# Patient Record
Sex: Female | Born: 1965 | Race: Black or African American | Hispanic: No | Marital: Married
Health system: Southern US, Community
[De-identification: ages and names within clinical notes are randomized; demographics above are authoritative.]

## PROBLEM LIST (undated history)

## (undated) DIAGNOSIS — G629 Polyneuropathy, unspecified: Secondary | ICD-10-CM

## (undated) DIAGNOSIS — E119 Type 2 diabetes mellitus without complications: Secondary | ICD-10-CM

## (undated) DIAGNOSIS — I10 Essential (primary) hypertension: Secondary | ICD-10-CM

## (undated) HISTORY — PX: ABDOMINAL HYSTERECTOMY: SHX81

---

## 2008-01-18 ENCOUNTER — Emergency Department: Payer: Self-pay | Admitting: Emergency Medicine

## 2008-04-21 ENCOUNTER — Ambulatory Visit: Payer: Self-pay | Admitting: Internal Medicine

## 2009-11-26 ENCOUNTER — Emergency Department: Payer: Self-pay | Admitting: Emergency Medicine

## 2010-08-09 ENCOUNTER — Other Ambulatory Visit: Payer: Self-pay | Admitting: Internal Medicine

## 2011-05-06 ENCOUNTER — Emergency Department: Payer: Self-pay | Admitting: Emergency Medicine

## 2011-06-16 ENCOUNTER — Ambulatory Visit: Payer: Self-pay | Admitting: Internal Medicine

## 2011-12-09 ENCOUNTER — Emergency Department: Payer: Self-pay | Admitting: Unknown Physician Specialty

## 2011-12-10 LAB — CBC
HCT: 37.6 % (ref 35.0–47.0)
HGB: 12.7 g/dL (ref 12.0–16.0)
MCH: 30.1 pg (ref 26.0–34.0)
MCV: 89 fL (ref 80–100)
Platelet: 237 10*3/uL (ref 150–440)
RBC: 4.23 10*6/uL (ref 3.80–5.20)
RDW: 13.1 % (ref 11.5–14.5)

## 2011-12-10 LAB — BASIC METABOLIC PANEL
Anion Gap: 11 (ref 7–16)
BUN: 17 mg/dL (ref 7–18)
EGFR (African American): >60
Glucose: 209 mg/dL — ABNORMAL HIGH (ref 65–99)
Potassium: 3.1 mmol/L — ABNORMAL LOW (ref 3.5–5.1)

## 2011-12-10 LAB — HEPATIC FUNCTION PANEL A (ARMC)
Bilirubin, Direct: 0.1 mg/dL (ref 0.00–0.20)
Bilirubin,Total: 0.6 mg/dL (ref 0.2–1.0)
SGPT (ALT): 38 U/L
Total Protein: 7.8 g/dL (ref 6.4–8.2)

## 2011-12-10 LAB — TROPONIN I: Troponin-I: <0.02 ng/mL

## 2012-06-09 ENCOUNTER — Emergency Department: Payer: Self-pay | Admitting: Emergency Medicine

## 2012-06-19 ENCOUNTER — Ambulatory Visit: Payer: Self-pay | Admitting: Internal Medicine

## 2012-06-21 ENCOUNTER — Ambulatory Visit: Payer: Self-pay | Admitting: Internal Medicine

## 2012-06-23 DIAGNOSIS — E1142 Type 2 diabetes mellitus with diabetic polyneuropathy: Secondary | ICD-10-CM | POA: Insufficient documentation

## 2012-06-23 DIAGNOSIS — E785 Hyperlipidemia, unspecified: Secondary | ICD-10-CM | POA: Insufficient documentation

## 2012-06-23 DIAGNOSIS — I10 Essential (primary) hypertension: Secondary | ICD-10-CM | POA: Insufficient documentation

## 2012-07-05 ENCOUNTER — Ambulatory Visit: Payer: Self-pay | Admitting: Internal Medicine

## 2012-10-22 DIAGNOSIS — M5020 Other cervical disc displacement, unspecified cervical region: Secondary | ICD-10-CM | POA: Insufficient documentation

## 2012-10-22 DIAGNOSIS — M5412 Radiculopathy, cervical region: Secondary | ICD-10-CM | POA: Insufficient documentation

## 2012-11-06 DIAGNOSIS — I5189 Other ill-defined heart diseases: Secondary | ICD-10-CM | POA: Insufficient documentation

## 2012-12-11 DIAGNOSIS — E1140 Type 2 diabetes mellitus with diabetic neuropathy, unspecified: Secondary | ICD-10-CM | POA: Insufficient documentation

## 2012-12-11 DIAGNOSIS — M542 Cervicalgia: Secondary | ICD-10-CM | POA: Insufficient documentation

## 2013-05-03 IMAGING — MG MM ADDITIONAL VIEWS AT NO CHARGE
1 series · 3 of 3 positions shown · non-contrast
Comparison: none

REASON FOR EXAM: AV LT SMALL ASYMMETRY
COMMENTS:

PROCEDURE:     MAM - MAM DGTL ADD VW LT  SCR  - July 05, 2012  [DATE]
RESULT:
Comparisons: 06/21/2012 and 06/16/2011.

[L ML · left · 3 of 3 slices shown]
[im 1/3]
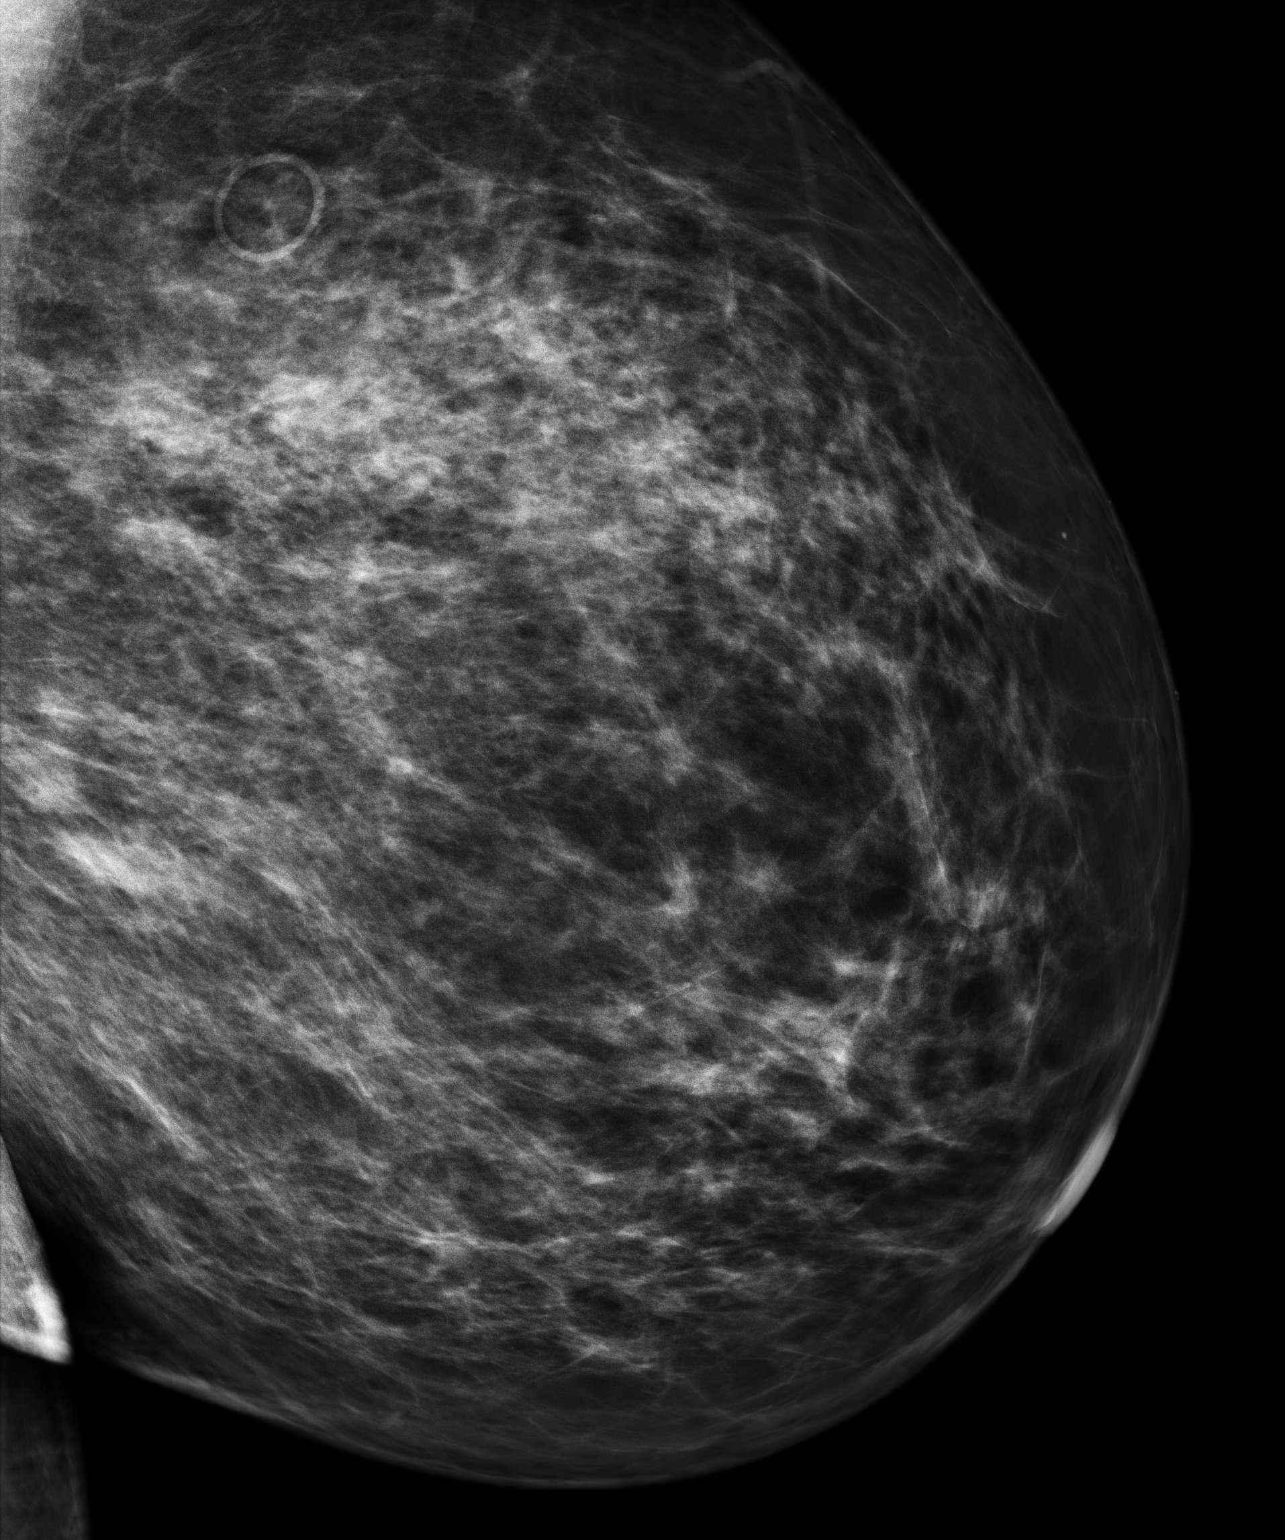
[im 2/3]
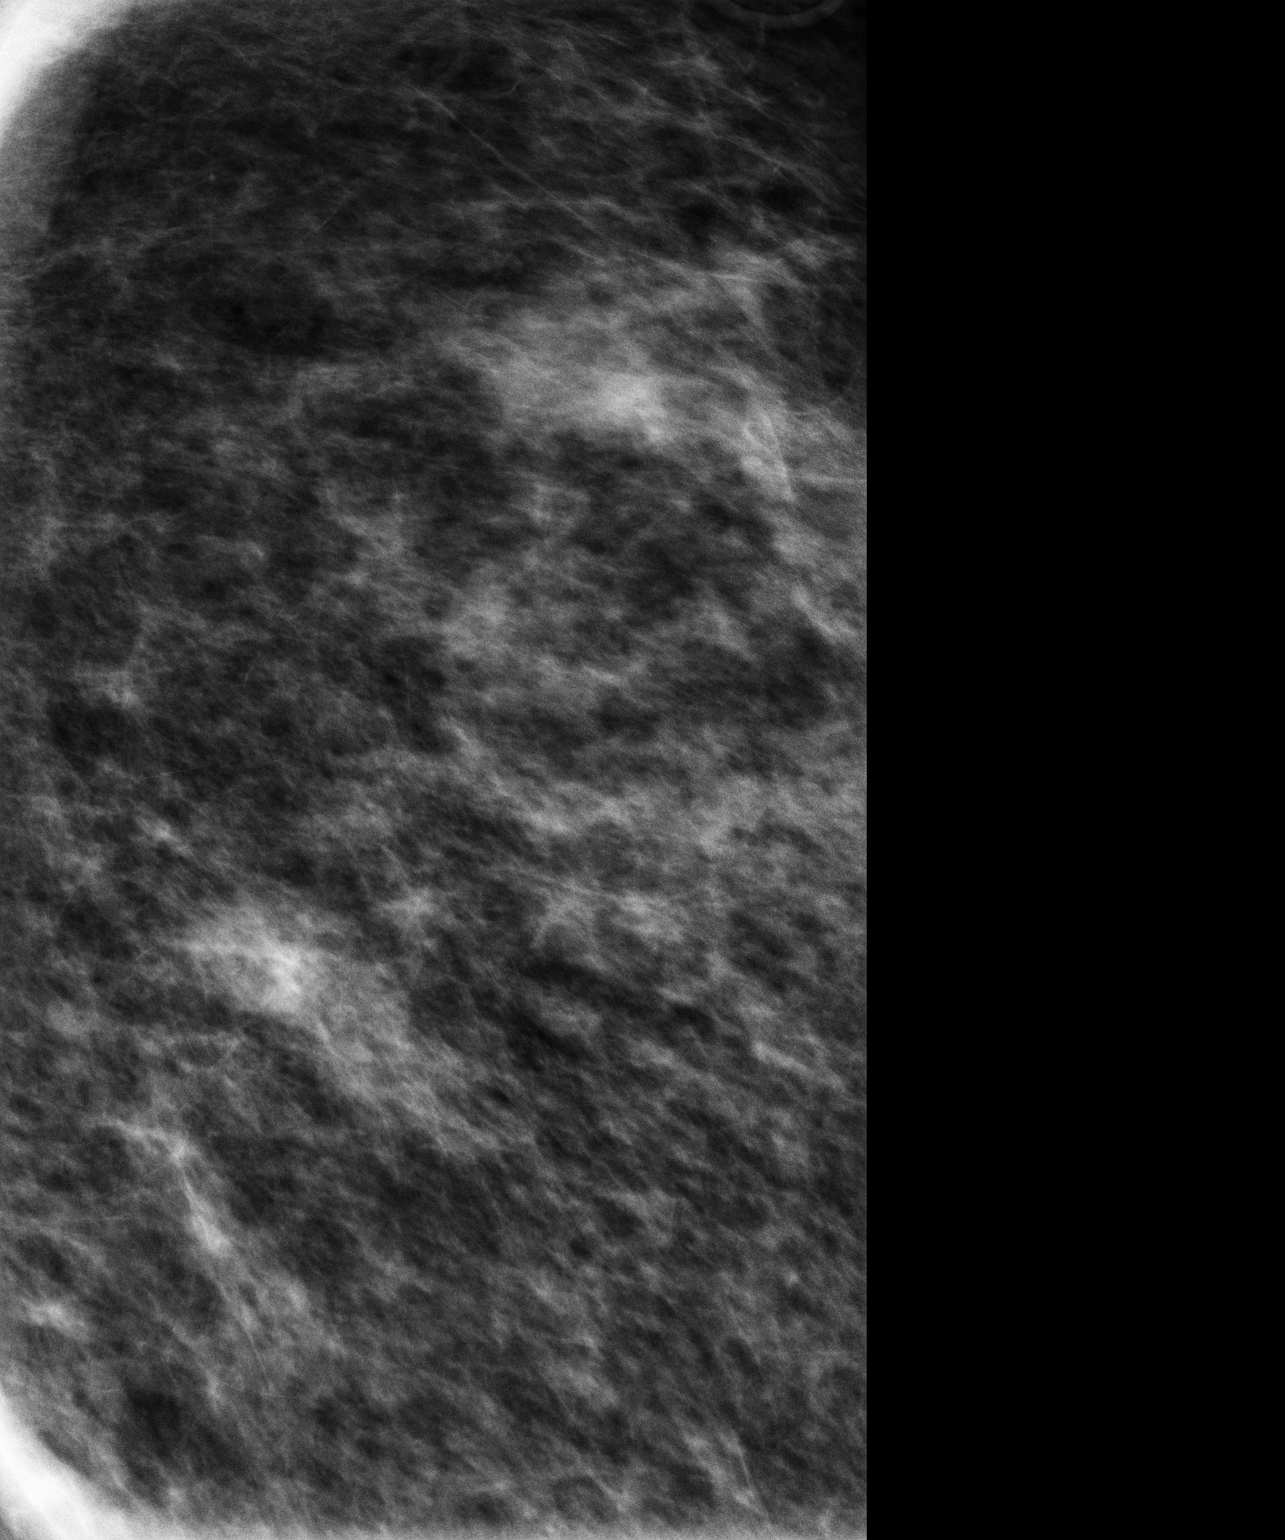
[im 3/3]
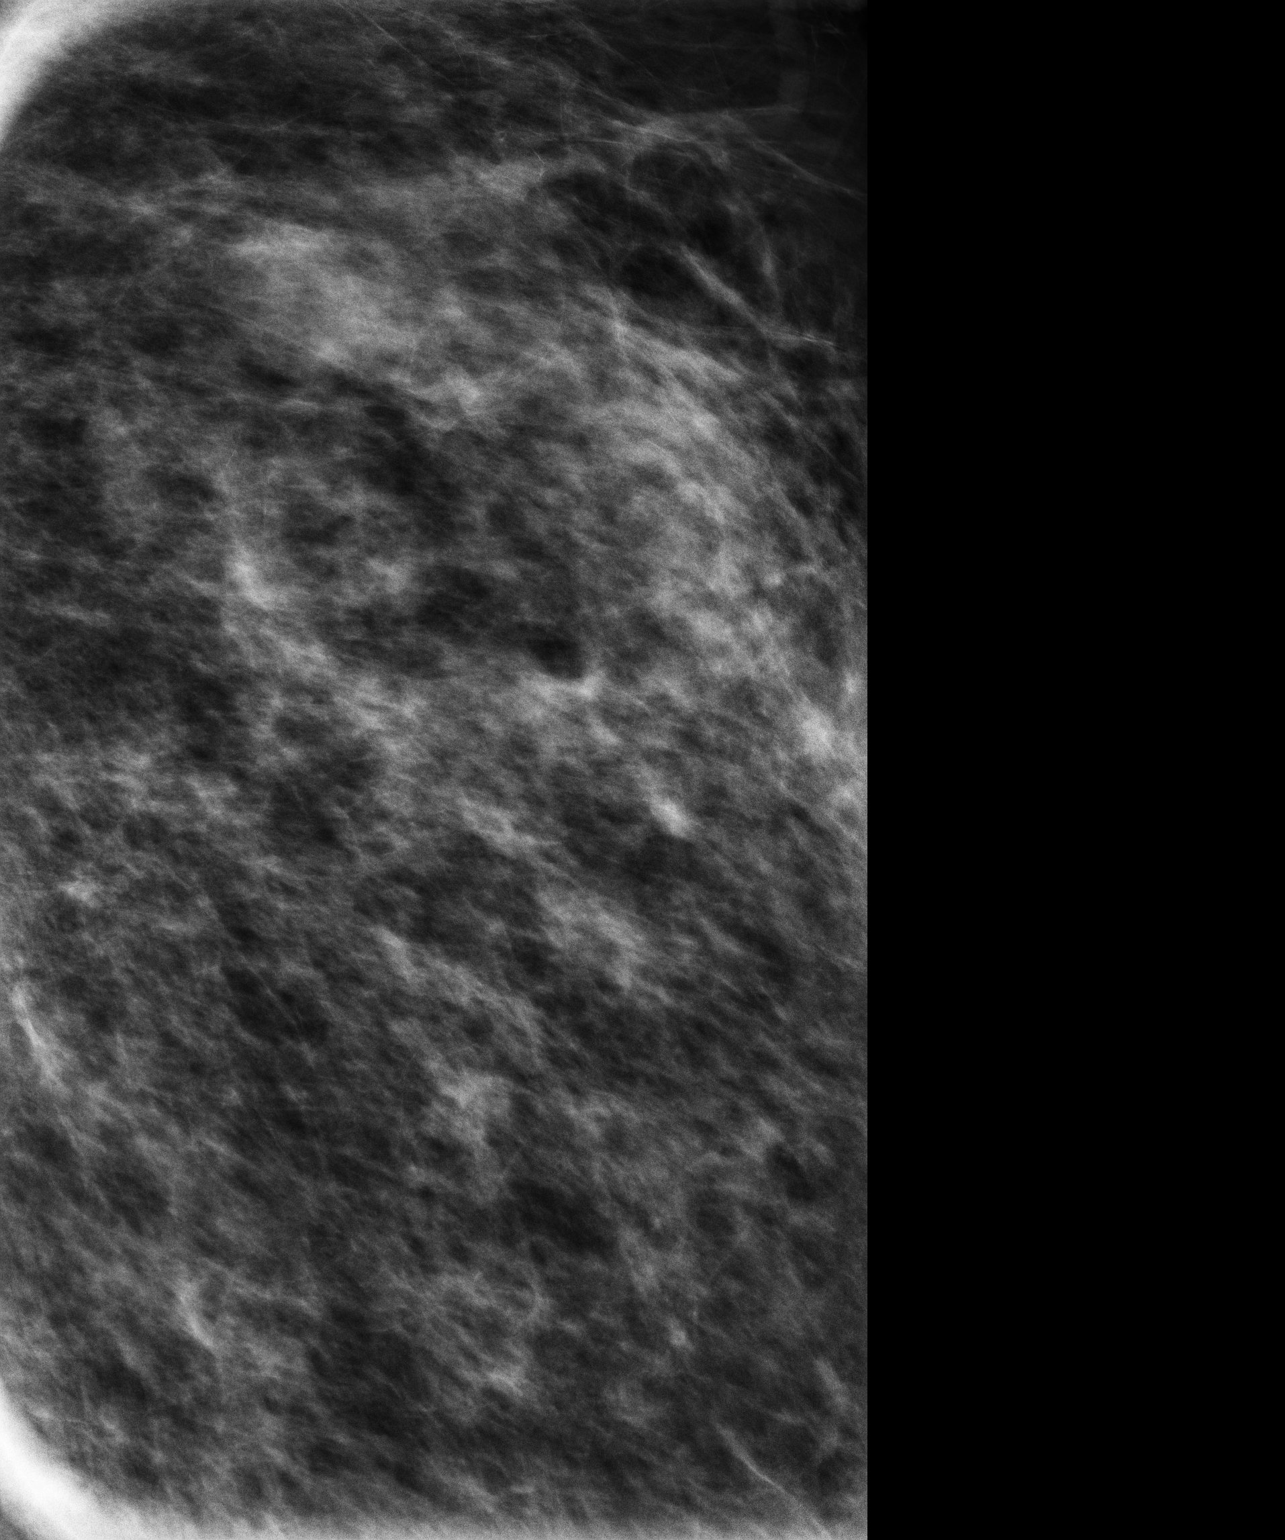

[3 of 3 positions shown; findings below may reference images not displayed]

FINDINGS: True lateral and spot compression magnification views were performed to
evaluate the asymmetry in the left MLO view. With these views, the asymmetry
effaced and assumed the appearance of normal fibroglandular tissue that was
similar to prior.
IMPRESSION: BI-RADS: Category 1 - Negative

Recommend continued annual screening mammography.

A NEGATIVE MAMMOGRAM REPORT DOES NOT PRECLUDE BIOPSY OR OTHER EVALUATION OF
A CLINICALLY PALPABLE OR OTHERWISE SUSPICIOUS MASS OR LESION. BREAST CANCER
MAY NOT BE DETECTED BY MAMMOGRAPHY IN UP TO 10% OF CASES.

## 2013-10-10 ENCOUNTER — Ambulatory Visit: Payer: BC Managed Care – PPO | Attending: Obstetrics and Gynecology | Admitting: Physical Therapy

## 2013-10-10 DIAGNOSIS — IMO0001 Reserved for inherently not codable concepts without codable children: Secondary | ICD-10-CM | POA: Insufficient documentation

## 2013-10-10 DIAGNOSIS — M62838 Other muscle spasm: Secondary | ICD-10-CM | POA: Insufficient documentation

## 2013-10-10 DIAGNOSIS — M2420 Disorder of ligament, unspecified site: Secondary | ICD-10-CM | POA: Insufficient documentation

## 2013-10-10 DIAGNOSIS — M629 Disorder of muscle, unspecified: Secondary | ICD-10-CM | POA: Insufficient documentation

## 2013-10-24 ENCOUNTER — Ambulatory Visit: Payer: BC Managed Care – PPO | Admitting: Physical Therapy

## 2013-10-29 ENCOUNTER — Ambulatory Visit: Payer: BC Managed Care – PPO | Admitting: Physical Therapy

## 2013-11-07 ENCOUNTER — Encounter: Payer: BC Managed Care – PPO | Admitting: Physical Therapy

## 2013-11-21 ENCOUNTER — Ambulatory Visit: Payer: BC Managed Care – PPO | Attending: Obstetrics and Gynecology | Admitting: Physical Therapy

## 2013-11-21 DIAGNOSIS — M2420 Disorder of ligament, unspecified site: Secondary | ICD-10-CM | POA: Insufficient documentation

## 2013-11-21 DIAGNOSIS — M629 Disorder of muscle, unspecified: Secondary | ICD-10-CM | POA: Insufficient documentation

## 2013-11-21 DIAGNOSIS — IMO0001 Reserved for inherently not codable concepts without codable children: Secondary | ICD-10-CM | POA: Insufficient documentation

## 2013-11-21 DIAGNOSIS — M62838 Other muscle spasm: Secondary | ICD-10-CM | POA: Insufficient documentation

## 2013-12-05 ENCOUNTER — Ambulatory Visit: Payer: BC Managed Care – PPO | Admitting: Physical Therapy

## 2015-07-16 DIAGNOSIS — K760 Fatty (change of) liver, not elsewhere classified: Secondary | ICD-10-CM | POA: Insufficient documentation

## 2015-07-16 DIAGNOSIS — L659 Nonscarring hair loss, unspecified: Secondary | ICD-10-CM | POA: Insufficient documentation

## 2016-03-04 ENCOUNTER — Emergency Department: Payer: No Typology Code available for payment source

## 2016-03-04 ENCOUNTER — Emergency Department
Admission: EM | Admit: 2016-03-04 | Discharge: 2016-03-04 | Disposition: A | Discharge status: Home / Self Care | Payer: No Typology Code available for payment source | Attending: Emergency Medicine | Admitting: Emergency Medicine

## 2016-03-04 DIAGNOSIS — Y92410 Unspecified street and highway as the place of occurrence of the external cause: Secondary | ICD-10-CM | POA: Diagnosis not present

## 2016-03-04 DIAGNOSIS — S0990XA Unspecified injury of head, initial encounter: Secondary | ICD-10-CM | POA: Diagnosis present

## 2016-03-04 DIAGNOSIS — I10 Essential (primary) hypertension: Secondary | ICD-10-CM | POA: Insufficient documentation

## 2016-03-04 DIAGNOSIS — S134XXA Sprain of ligaments of cervical spine, initial encounter: Secondary | ICD-10-CM | POA: Diagnosis not present

## 2016-03-04 DIAGNOSIS — E119 Type 2 diabetes mellitus without complications: Secondary | ICD-10-CM | POA: Diagnosis not present

## 2016-03-04 DIAGNOSIS — Y9389 Activity, other specified: Secondary | ICD-10-CM | POA: Insufficient documentation

## 2016-03-04 DIAGNOSIS — S161XXA Strain of muscle, fascia and tendon at neck level, initial encounter: Secondary | ICD-10-CM | POA: Diagnosis not present

## 2016-03-04 DIAGNOSIS — Y999 Unspecified external cause status: Secondary | ICD-10-CM | POA: Insufficient documentation

## 2016-03-04 HISTORY — DX: Type 2 diabetes mellitus without complications: E11.9

## 2016-03-04 HISTORY — DX: Essential (primary) hypertension: I10

## 2016-03-04 HISTORY — DX: Polyneuropathy, unspecified: G62.9

## 2016-03-04 MED ORDER — TRAMADOL HCL 50 MG PO TABS
50.0000 mg | ORAL_TABLET | Freq: Once | ORAL | Status: AC
  Administered 2016-03-04: 50 mg via ORAL
  Filled 2016-03-04: qty 1

## 2016-03-04 MED ORDER — ONDANSETRON 4 MG PO TBDP
4.0000 mg | ORAL_TABLET | Freq: Once | ORAL | Status: AC
  Administered 2016-03-04: 4 mg via ORAL
  Filled 2016-03-04: qty 1

## 2016-03-04 MED ORDER — OXYCODONE-ACETAMINOPHEN 5-325 MG PO TABS: 1.0000 | ORAL_TABLET | Freq: Once | ORAL | Status: DC

## 2016-03-04 MED ORDER — CARISOPRODOL 350 MG PO TABS: 350.0000 mg | ORAL_TABLET | Freq: Three times a day (TID) | ORAL | 0 refills | Status: AC | PRN

## 2016-03-04 MED ORDER — CYCLOBENZAPRINE HCL 10 MG PO TABS
5.0000 mg | ORAL_TABLET | Freq: Once | ORAL | Status: AC
  Administered 2016-03-04: 5 mg via ORAL
  Filled 2016-03-04: qty 1

## 2016-03-04 MED ORDER — OXYCODONE-ACETAMINOPHEN 5-325 MG PO TABS
1.0000 | ORAL_TABLET | Freq: Once | ORAL | Status: AC
  Administered 2016-03-04: 1 via ORAL
  Filled 2016-03-04: qty 1
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
## 2016-03-04 NOTE — ED Notes (Signed)
Pt returned from [facility]. Family is at the bedside.

## 2016-03-04 NOTE — ED Notes (Signed)
Pt alert and oriented X4, active, cooperative, pt in NAD. RR even and unlabored, color WNL.  Pt informed to return if any life threatening symptoms occur.   

## 2016-03-04 NOTE — ED Notes (Signed)
[employer] officer at the bedside speaking with pt.

## 2016-05-24 DIAGNOSIS — M7989 Other specified soft tissue disorders: Secondary | ICD-10-CM | POA: Insufficient documentation

## 2017-01-10 DIAGNOSIS — R600 Localized edema: Secondary | ICD-10-CM | POA: Insufficient documentation

## 2017-11-26 DIAGNOSIS — Z6841 Body Mass Index (BMI) 40.0 and over, adult: Secondary | ICD-10-CM | POA: Insufficient documentation

## 2018-01-09 DIAGNOSIS — R5383 Other fatigue: Secondary | ICD-10-CM | POA: Insufficient documentation

## 2018-01-09 DIAGNOSIS — F329 Major depressive disorder, single episode, unspecified: Secondary | ICD-10-CM | POA: Insufficient documentation

## 2018-06-01 ENCOUNTER — Other Ambulatory Visit: Payer: BLUE CROSS/BLUE SHIELD

## 2018-06-12 ENCOUNTER — Ambulatory Visit: Admit: 2018-06-12 | Payer: BLUE CROSS/BLUE SHIELD | Admitting: Orthopedic Surgery

## 2018-06-12 SURGERY — TENNIS ELBOW RELEASE/NIRSCHEL PROCEDURE
Anesthesia: Choice | Laterality: Left

## 2019-02-05 ENCOUNTER — Encounter: Payer: Self-pay | Admitting: Podiatry

## 2019-02-05 ENCOUNTER — Other Ambulatory Visit: Payer: Self-pay | Admitting: Podiatry

## 2019-02-05 ENCOUNTER — Ambulatory Visit (INDEPENDENT_AMBULATORY_CARE_PROVIDER_SITE_OTHER): Payer: BC Managed Care – PPO | Admitting: Podiatry

## 2019-02-05 ENCOUNTER — Other Ambulatory Visit: Payer: Self-pay

## 2019-02-05 ENCOUNTER — Ambulatory Visit (INDEPENDENT_AMBULATORY_CARE_PROVIDER_SITE_OTHER): Payer: BC Managed Care – PPO

## 2019-02-05 VITALS — Temp 97.3°F

## 2019-02-05 DIAGNOSIS — M79671 Pain in right foot: Secondary | ICD-10-CM

## 2019-02-05 DIAGNOSIS — M79672 Pain in left foot: Secondary | ICD-10-CM

## 2019-02-05 DIAGNOSIS — E0843 Diabetes mellitus due to underlying condition with diabetic autonomic (poly)neuropathy: Secondary | ICD-10-CM | POA: Diagnosis not present

## 2019-02-05 DIAGNOSIS — M722 Plantar fascial fibromatosis: Secondary | ICD-10-CM

## 2019-02-05 DIAGNOSIS — M779 Enthesopathy, unspecified: Secondary | ICD-10-CM | POA: Diagnosis not present

## 2019-02-05 DIAGNOSIS — G629 Polyneuropathy, unspecified: Secondary | ICD-10-CM | POA: Diagnosis not present

## 2019-02-05 MED ORDER — MELOXICAM 15 MG PO TABS: 15.0000 mg | ORAL_TABLET | Freq: Every day | ORAL | 0 refills | Status: DC

## 2019-02-05 NOTE — Patient Instructions (Signed)
If was nice to meet you today. If you have any questions or any further concerns, please feel fee to give me a call. You can call our office at [phone] or please feel fee to send me a message through [place].   For instructions on how to put on your Night Splint, please visit [email or web address]   Plantar Fasciitis (Heel Spur Syndrome) with Rehab The plantar fascia is a fibrous, ligament-like, soft-tissue structure that spans the bottom of the foot. Plantar fasciitis is a condition that causes pain in the foot due to inflammation of the tissue. SYMPTOMS   Pain and tenderness on the underneath side of the foot.  Pain that worsens with standing or walking. CAUSES  Plantar fasciitis is caused by irritation and injury to the plantar fascia on the underneath side of the foot. Common mechanisms of injury include:  Direct trauma to bottom of the foot.  Damage to a small nerve that runs under the foot where the main fascia attaches to the heel bone.  Stress placed on the plantar fascia due to bone spurs. RISK INCREASES WITH:   Activities that place stress on the plantar fascia (running, jumping, pivoting, or cutting).  Poor strength and flexibility.  Improperly fitted shoes.  Tight calf muscles.  Flat feet.  Failure to warm-up properly before activity.  Obesity. PREVENTION  Warm up and stretch properly before activity.  Allow for adequate recovery between workouts.  Maintain physical fitness:  Strength, flexibility, and endurance.  Cardiovascular fitness.  Maintain a health body weight.  Avoid stress on the plantar fascia.  Wear properly fitted shoes, including arch supports for individuals who have flat feet.  PROGNOSIS  If treated properly, then the symptoms of plantar fasciitis usually resolve without surgery. However, occasionally surgery is necessary.  RELATED COMPLICATIONS   Recurrent symptoms that may result in a chronic condition.  Problems of  the lower back that are caused by compensating for the injury, such as limping.  Pain or weakness of the foot during push-off following surgery.  Chronic inflammation, scarring, and partial or complete fascia tear, occurring more often from repeated injections.  TREATMENT  Treatment initially involves the use of ice and medication to help reduce pain and inflammation. The use of strengthening and stretching exercises may help reduce pain with activity, especially stretches of the Achilles tendon. These exercises may be performed at home or with a therapist. Your caregiver may recommend that you use heel cups of arch supports to help reduce stress on the plantar fascia. Occasionally, corticosteroid injections are given to reduce inflammation. If symptoms persist for greater than 6 months despite non-surgical (conservative), then surgery may be recommended.   MEDICATION   If pain medication is necessary, then nonsteroidal anti-inflammatory medications, such as aspirin and ibuprofen, or other minor pain relievers, such as acetaminophen, are often recommended.  Do not take pain medication within 7 days before surgery.  Prescription pain relievers may be given if deemed necessary by your caregiver. Use only as directed and only as much as you need.  Corticosteroid injections may be given by your caregiver. These injections should be reserved for the most serious cases, because they may only be given a certain number of times.  HEAT AND COLD  Cold treatment (icing) relieves pain and reduces inflammation. Cold treatment should be applied for 10 to 15 minutes every 2 to 3 hours for inflammation and pain and immediately after any activity that aggravates your symptoms. Use ice packs or massage the area with a  piece of ice (ice massage).  Heat treatment may be used prior to performing the stretching and strengthening activities prescribed by your caregiver, physical therapist, or athletic trainer. Use a  heat pack or soak the injury in warm water.  SEEK IMMEDIATE MEDICAL CARE IF:  Treatment seems to offer no benefit, or the condition worsens.  Any medications produce adverse side effects.  EXERCISES- RANGE OF MOTION (ROM) AND STRETCHING EXERCISES - Plantar Fasciitis (Heel Spur Syndrome) These exercises may help you when beginning to rehabilitate your injury. Your symptoms may resolve with or without further involvement from your physician, physical therapist or athletic trainer. While completing these exercises, remember:   Restoring tissue flexibility helps normal motion to return to the joints. This allows healthier, less painful movement and activity.  An effective stretch should be held for at least 30 seconds.  A stretch should never be painful. You should only feel a gentle lengthening or release in the stretched tissue.  RANGE OF MOTION - Toe Extension, Flexion  Sit with your right / left leg crossed over your opposite knee.  Grasp your toes and gently pull them back toward the top of your foot. You should feel a stretch on the bottom of your toes and/or foot.  Hold this stretch for 10 seconds.  Now, gently pull your toes toward the bottom of your foot. You should feel a stretch on the top of your toes and or foot.  Hold this stretch for 10 seconds. Repeat  times. Complete this stretch 3 times per day.   RANGE OF MOTION - Ankle Dorsiflexion, Active Assisted  Remove shoes and sit on a chair that is preferably not on a carpeted surface.  Place right / left foot under knee. Extend your opposite leg for support.  Keeping your heel down, slide your right / left foot back toward the chair until you feel a stretch at your ankle or calf. If you do not feel a stretch, slide your bottom forward to the edge of the chair, while still keeping your heel down.  Hold this stretch for 10 seconds. Repeat 3 times. Complete this stretch 2 times per day.   STRETCH  Gastroc, Standing   Place hands on wall.  Extend right / left leg, keeping the front knee somewhat bent.  Slightly point your toes inward on your back foot.  Keeping your right / left heel on the floor and your knee straight, shift your weight toward the wall, not allowing your back to arch.  You should feel a gentle stretch in the right / left calf. Hold this position for 10 seconds. Repeat 3 times. Complete this stretch 2 times per day.  STRETCH  Soleus, Standing  Place hands on wall.  Extend right / left leg, keeping the other knee somewhat bent.  Slightly point your toes inward on your back foot.  Keep your right / left heel on the floor, bend your back knee, and slightly shift your weight over the back leg so that you feel a gentle stretch deep in your back calf.  Hold this position for 10 seconds. Repeat 3 times. Complete this stretch 2 times per day.  STRETCH  Gastrocsoleus, Standing  Note: This exercise can place a lot of stress on your foot and ankle. Please complete this exercise only if specifically instructed by your caregiver.   Place the ball of your right / left foot on a step, keeping your other foot firmly on the same step.  Hold on to  the wall or a rail for balance.  Slowly lift your other foot, allowing your body weight to press your heel down over the edge of the step.  You should feel a stretch in your right / left calf.  Hold this position for 10 seconds.  Repeat this exercise with a slight bend in your right / left knee. Repeat 3 times. Complete this stretch 2 times per day.   STRENGTHENING EXERCISES - Plantar Fasciitis (Heel Spur Syndrome)  These exercises may help you when beginning to rehabilitate your injury. They may resolve your symptoms with or without further involvement from your physician, physical therapist or athletic trainer. While completing these exercises, remember:   Muscles can gain both the endurance and the strength needed for everyday activities  through controlled exercises.  Complete these exercises as instructed by your physician, physical therapist or athletic trainer. Progress the resistance and repetitions only as guided.  STRENGTH - Towel Curls  Sit in a chair positioned on a non-carpeted surface.  Place your foot on a towel, keeping your heel on the floor.  Pull the towel toward your heel by only curling your toes. Keep your heel on the floor. Repeat 3 times. Complete this exercise 2 times per day.  STRENGTH - Ankle Inversion  Secure one end of a rubber exercise band/tubing to a fixed object (table, pole). Loop the other end around your foot just before your toes.  Place your fists between your knees. This will focus your strengthening at your ankle.  Slowly, pull your big toe up and in, making sure the band/tubing is positioned to resist the entire motion.  Hold this position for 10 seconds.  Have your muscles resist the band/tubing as it slowly pulls your foot back to the starting position. Repeat 3 times. Complete this exercises 2 times per day.  Document Released: 06/27/2005 Document Revised: 09/19/2011 Document Reviewed: 10/09/2008 [facility] Patient Information 2014 [place], [place].  Meloxicam tablets What is this medicine? MELOXICAM (mel OX i cam) is a non-steroidal anti-inflammatory drug (NSAID). It is used to reduce swelling and to treat pain. It may be used for osteoarthritis, rheumatoid arthritis, or juvenile rheumatoid arthritis. This medicine may be used for other purposes; ask your health care provider or pharmacist if you have questions. COMMON BRAND NAME(S): Mobic What should I tell my health care provider before I take this medicine? They need to know if you have any of these conditions:  bleeding disorders  cigarette smoker  coronary artery bypass graft (CABG) surgery within the past 2 weeks  drink more than 3 alcohol-containing drinks per day  heart disease  high blood pressure  history  of stomach bleeding  kidney disease  liver disease  lung or breathing disease, like asthma  stomach or intestine problems  an unusual or allergic reaction to meloxicam, aspirin, other NSAIDs, other medicines, foods, dyes, or preservatives  pregnant or trying to get pregnant  breast-feeding How should I use this medicine? Take this medicine by mouth with a full glass of water. Follow the directions on the prescription label. You can take it with or without food. If it upsets your stomach, take it with food. Take your medicine at regular intervals. Do not take it more often than directed. Do not stop taking except on your doctor's advice. A special MedGuide will be given to you by the pharmacist with each prescription and refill. Be sure to read this information carefully each time. Talk to your pediatrician regarding the use of this medicine  in children. While this drug may be prescribed for selected conditions, precautions do apply. Patients over 51 years old may have a stronger reaction and need a smaller dose. Overdosage: If you think you have taken too much of this medicine contact a poison control center or emergency room at once. NOTE: This medicine is only for you. Do not share this medicine with others. What if I miss a dose? If you miss a dose, take it as soon as you can. If it is almost time for your next dose, take only that dose. Do not take double or extra doses. What may interact with this medicine? Do not take this medicine with any of the following medications:  cidofovir  ketorolac This medicine may also interact with the following medications:  aspirin and aspirin-like medicines  certain medicines for blood pressure, heart disease, irregular heart beat  certain medicines for depression, anxiety, or psychotic disturbances  certain medicines that treat or prevent blood clots like warfarin, enoxaparin, dalteparin, apixaban, dabigatran, rivaroxaban  cyclosporine   diuretics  fluconazole  lithium  methotrexate  other NSAIDs, medicines for pain and inflammation, like ibuprofen and naproxen  pemetrexed This list may not describe all possible interactions. Give your health care provider a list of all the medicines, herbs, non-prescription drugs, or dietary supplements you use. Also tell them if you smoke, drink alcohol, or use illegal drugs. Some items may interact with your medicine. What should I watch for while using this medicine? Tell your doctor or healthcare provider if your symptoms do not start to get better or if they get worse. This medicine may cause serious skin reactions. They can happen weeks to months after starting the medicine. Contact your healthcare provider right away if you notice fevers or flu-like symptoms with a rash. The rash may be red or purple and then turn into blisters or peeling of the skin. Or, you might notice a red rash with swelling of the face, lips or lymph nodes in your neck or under your arms. Do not take other medicines that contain aspirin, ibuprofen, or naproxen with this medicine. Side effects such as stomach upset, nausea, or ulcers may be more likely to occur. Many medicines available without a prescription should not be taken with this medicine. This medicine can cause ulcers and bleeding in the stomach and intestines at any time during treatment. This can happen with no warning and may cause death. There is increased risk with taking this medicine for a long time. Smoking, drinking alcohol, older age, and poor health can also increase risks. Call your doctor right away if you have stomach pain or blood in your vomit or stool. This medicine does not prevent heart attack or stroke. In fact, this medicine may increase the chance of a heart attack or stroke. The chance may increase with longer use of this medicine and in people who have heart disease. If you take aspirin to prevent heart attack or stroke, talk with  your doctor or healthcare provider. What side effects may I notice from receiving this medicine? Side effects that you should report to your doctor or health care professional as soon as possible:  allergic reactions like skin rash, itching or hives, swelling of the face, lips, or tongue  nausea, vomiting  redness, blistering, peeling, or loosening of the skin, including inside the mouth  signs and symptoms of a blood clot such as breathing problems; changes in vision; chest pain; severe, sudden headache; pain, swelling, warmth in the leg;  trouble speaking; sudden numbness or weakness of the face, arm, or leg  signs and symptoms of bleeding such as bloody or black, tarry stools; red or dark-brown urine; spitting up blood or brown material that looks like coffee grounds; red spots on the skin; unusual bruising or bleeding from the eye, gums, or nose  signs and symptoms of liver injury like dark yellow or brown urine; general ill feeling or flu-like symptoms; light-colored stools; loss of appetite; nausea; right upper belly pain; unusually weak or tired; yellowing of the eyes or skin  signs and symptoms of stroke like changes in vision; confusion; trouble speaking or understanding; severe headaches; sudden numbness or weakness of the face, arm, or leg; trouble walking; dizziness; loss of balance or coordination Side effects that usually do not require medical attention (report to your doctor or health care professional if they continue or are bothersome):  constipation  diarrhea  gas This list may not describe all possible side effects. Call your doctor for medical advice about side effects. You may report side effects to FDA at 1-800-FDA-1088. Where should I keep my medicine? Keep out of the reach of children. Store at room temperature between 15 and 30 degrees C (59 and 86 degrees F). Throw away any unused medicine after the expiration date. NOTE: This sheet is a summary. It may not cover  all possible information. If you have questions about this medicine, talk to your doctor, pharmacist, or health care provider.  2020 Elsevier/Gold Standard (2018-09-26 11:21:28)

## 2019-02-08 NOTE — Progress Notes (Signed)
Subjective:   Patient ID: Erika Elliott, female   DOB: 53 y.o.   MRN: [id number]   HPI 53 year old female presents the office today for concerns of pain to both of her feet mostly in the heels.  She says is been ongoing last 3 weeks.  She has a history of plantar fasciitis.  She had injections previously which were helpful.  She has not had some in several years.  She has pain most in the morning when she first gets up after being on her feet all day she sits down and stands back up is when she gets pain.  She is on her feet a lot as a CNA.  No recent injury or falls.  She does have neuropathy as well.   Review of Systems  All other systems reviewed and are negative.  Past Medical History:  Diagnosis Date  . Diabetes mellitus without complication ([place])   . Hypertension   . Neuropathy     Past Surgical History:  Procedure Laterality Date  . ABDOMINAL HYSTERECTOMY       Current Outpatient Medications:  .  atorvastatin (LIPITOR) 10 MG tablet, Take 10 mg by mouth daily., Disp: , Rfl:  .  gabapentin (NEURONTIN) 100 MG capsule, Take 100 mg by mouth 3 (three) times daily., Disp: , Rfl:  .  metFORMIN (GLUCOPHAGE) 500 MG tablet, Take by mouth 2 (two) times daily with a meal., Disp: , Rfl:  .  meloxicam (MOBIC) 15 MG tablet, Take 1 tablet (15 mg total) by mouth daily., Disp: 14 tablet, Rfl: 0  Allergies  Allergen Reactions  . Oxycodone-Acetaminophen Nausea Only        Objective:  Physical Exam  General: AAO x3, NAD  Dermatological: Skin is warm, dry and supple bilateral. Nails x 10 are well manicured; remaining integument appears unremarkable at this time. There are no open sores, no preulcerative lesions, no rash or signs of infection present.  Vascular: Dorsalis Pedis artery and Posterior Tibial artery pedal pulses are 2/4 bilateral with immedate capillary fill time. There is no pain with calf compression, swelling, warmth, erythema.   Neruologic: Previously diagnosed with  neuropathy. Negative tinel sign.   Musculoskeletal: Tenderness palpation on the plantar medial tubercle of the calcaneus at the insertion of plantar fascia bilaterally.  Mild discomfort in the arch of the foot on medial band.  Plantar fascial appears to be intact.  Achilles tendon appears to be intact.  No pain with lateral compression of calcaneus.  No other areas of tenderness.  Muscular strength 5/5 in all groups tested bilateral.  Gait: Unassisted, Nonantalgic.     Assessment:   53 year old female with bilateral foot pain, plantar fasciitis; neuropathy    Plan:  -Treatment options discussed including all alternatives, risks, and complications -Etiology of symptoms were discussed -X-rays were obtained and reviewed with the patient.  No evidence of acute fracture or stress fracture. -Plantar fascial brace dispensed x2 -Night splint x1 -Steroid injection bilaterally.  See procedure note below -Stretching, icing daily -Shoe modifications and orthotics  Procedure: Injection Tendon/Ligament Discussed alternatives, risks, complications and verbal consent was obtained.  Location: Bilateral plantar fascia at the glabrous junction; medial approach. Skin Prep: Alcohol Injectate: 0.5cc 0.5% marcaine plain, 0.5 cc 2% lidocaine plain and, 1 cc kenalog 10. Disposition: Patient tolerated procedure well. Injection site dressed with a band-aid.  Post-injection care was discussed and return precautions discussed.    Trula Slade DPM

## 2019-02-26 ENCOUNTER — Other Ambulatory Visit: Payer: Self-pay

## 2019-02-26 ENCOUNTER — Encounter: Payer: Self-pay | Admitting: Podiatry

## 2019-02-26 ENCOUNTER — Ambulatory Visit (INDEPENDENT_AMBULATORY_CARE_PROVIDER_SITE_OTHER): Payer: BC Managed Care – PPO | Admitting: Podiatry

## 2019-02-26 ENCOUNTER — Telehealth: Payer: Self-pay | Admitting: *Deleted

## 2019-02-26 VITALS — Temp 98.3°F

## 2019-02-26 DIAGNOSIS — M722 Plantar fascial fibromatosis: Secondary | ICD-10-CM | POA: Diagnosis not present

## 2019-02-26 DIAGNOSIS — R252 Cramp and spasm: Secondary | ICD-10-CM

## 2019-02-26 DIAGNOSIS — M7989 Other specified soft tissue disorders: Secondary | ICD-10-CM | POA: Diagnosis not present

## 2019-02-26 DIAGNOSIS — R609 Edema, unspecified: Secondary | ICD-10-CM

## 2019-02-26 MED ORDER — MELOXICAM 15 MG PO TABS: 15.0000 mg | ORAL_TABLET | Freq: Every day | ORAL | 0 refills | Status: AC

## 2019-02-26 NOTE — Telephone Encounter (Signed)
Called phone number listed for Erika Elliott, female voice stated the phone number was incorrect.

## 2019-02-26 NOTE — Patient Instructions (Signed)

## 2019-02-26 NOTE — Telephone Encounter (Signed)
Unable to discuss pt's location preference for vascular testing due to phone line busy.

## 2019-02-26 NOTE — Telephone Encounter (Signed)
Unable to speak with pt phone was busy.

## 2019-02-26 NOTE — Telephone Encounter (Signed)
Faxed orders for venous doppler and arterial doppler to [place].

## 2019-02-28 ENCOUNTER — Other Ambulatory Visit: Payer: Self-pay

## 2019-02-28 ENCOUNTER — Other Ambulatory Visit: Payer: Self-pay | Admitting: Podiatry

## 2019-02-28 ENCOUNTER — Ambulatory Visit (HOSPITAL_COMMUNITY)
Admission: RE | Admit: 2019-02-28 | Discharge: 2019-02-28 | Disposition: A | Discharge status: Home / Self Care | Payer: BC Managed Care – PPO | Source: Ambulatory Visit | Attending: Cardiology | Admitting: Cardiology

## 2019-02-28 DIAGNOSIS — R252 Cramp and spasm: Secondary | ICD-10-CM | POA: Insufficient documentation

## 2019-02-28 DIAGNOSIS — R609 Edema, unspecified: Secondary | ICD-10-CM | POA: Insufficient documentation

## 2019-03-01 ENCOUNTER — Ambulatory Visit (HOSPITAL_COMMUNITY)
Admission: RE | Admit: 2019-03-01 | Discharge: 2019-03-01 | Disposition: A | Discharge status: Home / Self Care | Payer: BC Managed Care – PPO | Source: Ambulatory Visit | Attending: Cardiology | Admitting: Cardiology

## 2019-03-01 ENCOUNTER — Telehealth: Payer: Self-pay | Admitting: *Deleted

## 2019-03-01 DIAGNOSIS — R252 Cramp and spasm: Secondary | ICD-10-CM | POA: Insufficient documentation

## 2019-03-01 NOTE — Telephone Encounter (Signed)
I informed pt of Dr. Wagoner's review of results. 

## 2019-03-01 NOTE — Telephone Encounter (Signed)
-----   Message from Matthew R Wagoner, DPM sent at 03/01/2019  6:56 AM EDT ----- Negative for DVT- val can you please let her know? Thanks. 

## 2019-03-04 NOTE — Progress Notes (Signed)
Subjective: 53 year old female presents the office today for follow-up evaluation of plantar fasciitis, heel pain. She states that the injection, bracing, stretching is helping. She has been getting swelling and cramping to her calf which is new. No injury or changes otherwise.  Denies any systemic complaints such as fevers, chills, nausea, vomiting. No acute changes since last appointment, and no other complaints at this time.   Objective: AAO x3, NAD DP/PT pulses palpable bilaterally, CRT less than 3 seconds There is improvement yet continued tenderness palpation on plantar medial tubercle of the calcaneus insertion of plantar fascia bilaterally.  Plantar fascial peers to be intact. Across the calcaneus.  No pain Achilles tendon.  Negative Tinel sign. No open lesions or pre-ulcerative lesions.  No pain with calf compression, swelling, warmth, erythema  Assessment: Bilateral heel pain, plantar fasciitis; leg swelling/cramping  Plan: -All treatment options discussed with the patient including all alternatives, risks, complications.  -Due to the swelling of the cramping will order arterial studies. -Plan continue stretching, icing daily.  Continue plantar fascial braces and night splint. Discussed modifications and orthotics. -Patient encouraged to call the office with any questions, concerns, change in symptoms.   Trula Slade DPM

## 2019-03-05 ENCOUNTER — Telehealth: Payer: Self-pay | Admitting: *Deleted

## 2019-03-05 NOTE — Telephone Encounter (Signed)
I informed pt of Dr. Leigh Aurora review of Venous doppler results.

## 2019-03-05 NOTE — Telephone Encounter (Signed)
-----   Message from Trula Slade, DPM sent at 03/01/2019  6:56 AM EDT ----- Negative for DVT- val can you please let her know? Thanks.

## 2019-03-06 ENCOUNTER — Telehealth: Payer: Self-pay | Admitting: *Deleted

## 2019-03-06 NOTE — Telephone Encounter (Signed)
Pt called and I informed of Dr. Wagoner's review of results. 

## 2019-03-06 NOTE — Telephone Encounter (Deleted)
-----   Message from Matthew R Wagoner, DPM sent at 03/01/2019  3:58 PM EDT ----- Val- please let her know that the circulation test was normal 

## 2019-03-06 NOTE — Telephone Encounter (Signed)
-----   Message from Trula Slade, DPM sent at 03/01/2019  3:58 PM EDT ----- Val- please let her know that the circulation test was normal

## 2019-03-06 NOTE — Telephone Encounter (Signed)
Left message requesting a call back to discuss results. 

## 2019-03-26 ENCOUNTER — Ambulatory Visit: Payer: BC Managed Care – PPO | Admitting: Podiatry

## 2019-04-16 ENCOUNTER — Other Ambulatory Visit: Payer: Self-pay

## 2019-04-16 ENCOUNTER — Ambulatory Visit (INDEPENDENT_AMBULATORY_CARE_PROVIDER_SITE_OTHER): Payer: BC Managed Care – PPO | Admitting: Podiatry

## 2019-04-16 DIAGNOSIS — M779 Enthesopathy, unspecified: Secondary | ICD-10-CM

## 2019-04-16 DIAGNOSIS — E1149 Type 2 diabetes mellitus with other diabetic neurological complication: Secondary | ICD-10-CM

## 2019-04-16 DIAGNOSIS — M722 Plantar fascial fibromatosis: Secondary | ICD-10-CM

## 2019-04-16 MED ORDER — IBUPROFEN 600 MG PO TABS: 600.0000 mg | ORAL_TABLET | Freq: Three times a day (TID) | ORAL | 0 refills | Status: DC | PRN

## 2019-04-18 ENCOUNTER — Telehealth: Payer: Self-pay | Admitting: *Deleted

## 2019-04-18 DIAGNOSIS — M779 Enthesopathy, unspecified: Secondary | ICD-10-CM

## 2019-04-18 DIAGNOSIS — T148XXA Other injury of unspecified body region, initial encounter: Secondary | ICD-10-CM

## 2019-04-18 NOTE — Telephone Encounter (Signed)
-----   Message from Trula Slade, DPM sent at 04/18/2019  7:12 AM EDT ----- Can you please order an MRI of the left ankle to rule out partial tear of flexor tendons? Thanks.

## 2019-04-18 NOTE — Progress Notes (Signed)
Subjective: 53 year old female presents the office today for follow-up evaluation of bilateral foot pain with left side worse than the right.  She said the right side starting to hurt more because of compensation.  She also states on the left ankle she started get discomfort she points along both sides of her ankle.  Minimal swelling on the left side but no erythema or warmth.  No recent injury or falls.  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  Denies any systemic complaints such as fevers, chills, nausea, vomiting. No acute changes since last appointment, and no other complaints at this time.   Objective: AAO x3, NAD DP/PT pulses palpable bilaterally, CRT less than 3 seconds There is continuation of tenderness on the plantar medial tubercle of the calcaneus at the insertion of plantar fascia.  Plantar fascia appears to be intact.  No pain with Achilles tendon bilaterally.  In the left side there is tenderness on the course of the flexor, posterior tibial tendon as well as the peroneal tendons just posterior to the malleoli.  Overall the tendons appear to be intact.  Minimal swelling left ankle but no erythema or warmth.  No pain with calf compression, swelling, warmth, erythema  Assessment: Bilateral plantar fasciitis with tendinitis on the left ankle likely result of compensation  Plan: -All treatment options discussed with the patient including all alternatives, risks, complications.  -Steroid injections performed bilaterally. -Left ankle brace was dispensed. -Continue with supportive shoes and orthotics.  Continue stretching, icing daily.  I will order an MRI of the left ankle to rule out partial tear of the flexor tendons. -I do think some of her symptoms are a result of neuropathy.  Continue current gabapentin. -Patient encouraged to call the office with any questions, concerns, change in symptoms.   Procedure: Injection Tendon/Ligament Discussed alternatives, risks, complications  and verbal consent was obtained.  Location: Bilateral plantar fascia at the glabrous junction; medial approach. Skin Prep: Alcohol Injectate: 0.5cc 0.5% marcaine plain, 0.5 cc 2% lidocaine plain and, 1 cc kenalog 10. Disposition: Patient tolerated procedure well. Injection site dressed with a band-aid.  Post-injection care was discussed and return precautions discussed.   Return in about 3 weeks (around 05/07/2019) for plantar fasciitis/MRI results .  Trula Slade DPM

## 2019-04-18 NOTE — Telephone Encounter (Signed)
Orders to L. Cox, CMA for pre-cert, faxed to [facility]. 

## 2019-04-23 ENCOUNTER — Telehealth: Payer: Self-pay | Admitting: *Deleted

## 2019-04-23 NOTE — Telephone Encounter (Signed)
Called and spoke with Erika Elliott at [facility]) and the representative stated that the procedure code [id number] was approved and the authorization number is [id number] and is valid 04/22/2019 thru 05/22/2019 and the [facility] phone number [phone]. Lattie Haw

## 2019-05-08 ENCOUNTER — Ambulatory Visit
Admission: RE | Admit: 2019-05-08 | Discharge: 2019-05-08 | Disposition: A | Discharge status: Home / Self Care | Payer: BC Managed Care – PPO | Source: Ambulatory Visit | Attending: Podiatry | Admitting: Podiatry

## 2019-05-08 ENCOUNTER — Other Ambulatory Visit: Payer: Self-pay

## 2019-05-10 ENCOUNTER — Other Ambulatory Visit: Payer: Self-pay

## 2019-05-10 ENCOUNTER — Encounter: Payer: Self-pay | Admitting: Podiatry

## 2019-05-10 ENCOUNTER — Ambulatory Visit (INDEPENDENT_AMBULATORY_CARE_PROVIDER_SITE_OTHER): Payer: BC Managed Care – PPO | Admitting: Podiatry

## 2019-05-10 DIAGNOSIS — M722 Plantar fascial fibromatosis: Secondary | ICD-10-CM | POA: Diagnosis not present

## 2019-05-10 DIAGNOSIS — E1149 Type 2 diabetes mellitus with other diabetic neurological complication: Secondary | ICD-10-CM

## 2019-05-10 DIAGNOSIS — M779 Enthesopathy, unspecified: Secondary | ICD-10-CM

## 2019-05-10 NOTE — Progress Notes (Signed)
Subjective: 53 year old female presents the office today for evaluation of plantar fasciitis as well as to discuss MRI results.  She said the injections were helpful but the pain started to come back.  She is also given the stretching exercises as well as ice. Denies any systemic complaints such as fevers, chills, nausea, vomiting. No acute changes since last appointment, and no other complaints at this time.   Objective: AAO x3, NAD DP/PT pulses palpable bilaterally, CRT less than 3 seconds Continuation of tenderness from plantar medial tubercle of the calcaneus at the insertion of plantar fascial bilaterally with left side worse than the right.  There is no pain with lateral compression of calcaneus.  There is no edema.  No pain Achilles tendon.   Improved tenderness on medial aspect of the ankle, posterior tibial tendo. No other areas of pinpoint tenderness.  Negative Tinel sign. No pain with calf compression, swelling, warmth, erythema  Assessment: Plantar fasciitis, tendinitis  Plan: -All treatment options discussed with the patient including all alternatives, risks, complications.  -Steroid injection performed.  See procedure note below. -We will start physical therapy.  Prescription written for [facility]. -Continue with stretching, icing, supportive shoes and orthotics. -Note provided to return to work on Monday. -Patient encouraged to call the office with any questions, concerns, change in symptoms.   Procedure: Injection Tendon/Ligament Discussed alternatives, risks, complications and verbal consent was obtained.  Location: Plantar medial heel along plantar fascia Skin Prep: Alcohol  Injectate: 0.5cc 0.5% marcaine plain, 0.5 cc 2% lidocaine plain and, 1 cc kenalog 10. Disposition: Patient tolerated procedure well. Injection site dressed with a band-aid.  Post-injection care was discussed and return precautions discussed.   Return in about 4 weeks (around  06/07/2019).  Trula Slade DPM

## 2019-05-10 NOTE — Patient Instructions (Signed)

## 2019-06-18 ENCOUNTER — Ambulatory Visit: Payer: BC Managed Care – PPO | Admitting: Podiatry

## 2019-06-25 ENCOUNTER — Other Ambulatory Visit: Payer: Self-pay

## 2019-06-25 ENCOUNTER — Ambulatory Visit (INDEPENDENT_AMBULATORY_CARE_PROVIDER_SITE_OTHER): Payer: BC Managed Care – PPO | Admitting: Podiatry

## 2019-06-25 DIAGNOSIS — M722 Plantar fascial fibromatosis: Secondary | ICD-10-CM | POA: Diagnosis not present

## 2019-06-25 DIAGNOSIS — M779 Enthesopathy, unspecified: Secondary | ICD-10-CM | POA: Diagnosis not present

## 2019-06-25 NOTE — Patient Instructions (Signed)

## 2019-06-26 MED ORDER — IBUPROFEN 600 MG PO TABS: 600.0000 mg | ORAL_TABLET | Freq: Three times a day (TID) | ORAL | 0 refills | Status: AC | PRN

## 2019-06-26 NOTE — Progress Notes (Signed)
Subjective: 53 year old female presents the office today for evaluation of plantar fasciitis, foot pain.  She said that she feels the injection did help.  She did finish the anti-inflammatories which were helpful.  She states that she feels that her left foot gives out at times and she is not sure why.  She did physical therapy for 4 sessions but she felt that it made the pain worse and she stopped.  She is been wearing new balance shoes at work.  She does not wear inserts. Denies any systemic complaints such as fevers, chills, nausea, vomiting. No acute changes since last appointment, and no other complaints at this time.   Objective: AAO x3, NAD DP/PT pulses palpable bilaterally, CRT less than 3 seconds Continuation of tenderness from plantar medial tubercle of the calcaneus at the insertion of plantar fascial on the left side.  There is no pain with lateral compression of calcaneus.  There is no edema.  No pain Achilles tendon.  No significant tenderness to palpation on the ankle specifically there is no pain on the course of the posterior tibial tendon.  Flexor, extensor tendons appear to be intact. No pain with calf compression, swelling, warmth, erythema  Assessment: Plantar fasciitis, tendinitis  Plan: -All treatment options discussed with the patient including all alternatives, risks, complications.  -Today she was placed into a cam boot. -We will check orthotic coverage through her insurance.  She is wearing better shoes prior to that she has good support and arch support. -Continue with stretching, icing exercises daily. -We discussed EPAT any information was provided.   Trula Slade DPM

## 2019-07-23 ENCOUNTER — Ambulatory Visit: Payer: BC Managed Care – PPO | Admitting: Orthotics

## 2019-07-23 ENCOUNTER — Other Ambulatory Visit: Payer: Self-pay

## 2019-07-23 ENCOUNTER — Ambulatory Visit (INDEPENDENT_AMBULATORY_CARE_PROVIDER_SITE_OTHER): Payer: BC Managed Care – PPO | Admitting: Podiatry

## 2019-07-23 DIAGNOSIS — M722 Plantar fascial fibromatosis: Secondary | ICD-10-CM

## 2019-07-23 DIAGNOSIS — E1149 Type 2 diabetes mellitus with other diabetic neurological complication: Secondary | ICD-10-CM

## 2019-07-23 NOTE — Progress Notes (Signed)
Patient came in today to pick up custom made foot orthotics.  The goals were accomplished and the patient reported no dissatisfaction with said orthotics.  Patient was advised of breakin period and how to report any issues. 

## 2019-07-24 DIAGNOSIS — M722 Plantar fascial fibromatosis: Secondary | ICD-10-CM | POA: Insufficient documentation

## 2019-07-24 NOTE — Progress Notes (Signed)
Subjective: 54 year old female presents the office today for evaluation of plantar fasciitis, foot pain.  She states that overall she is doing about the same but she has noticed some improvement to her heel.  She presents today for further evaluation as well as to pick up orthotics.  She did get her inserts today she states they are comfortable.  She was discussed EPAT treatment in more detail.  No changes otherwise.   Objective: AAO x3, NAD DP/PT pulses palpable bilaterally, CRT less than 3 seconds Continuation of tenderness from plantar medial tubercle of the calcaneus at the insertion of plantar fascial on the left side, mild improvement.  There is no pain with lateral compression of calcaneus.  There is no edema.  No pain Achilles tendon.  No significant tenderness to palpation on the ankle specifically there is no pain on the course of the posterior tibial tendon.  Flexor, extensor tendons appear to be intact. No pain with calf compression, swelling, warmth, erythema  Assessment: Plantar fasciitis, tendinitis  Plan: -All treatment options discussed with the patient including all alternatives, risks, complications.  -She picked up orthotics today.  Discussed break-in instructions with her.  We also discussed alternative treatments we discussed EPAT.  We discussed this is not a guarantee however we discussed success rates as well.  She wants to start this and we will schedule her hopefully later this week at her convenience conservative treatment.  Also continue stretching, icing exercises.  No follow-ups on file.  Erika Elliott DPM

## 2019-08-09 ENCOUNTER — Other Ambulatory Visit: Payer: Self-pay

## 2019-08-09 ENCOUNTER — Ambulatory Visit (INDEPENDENT_AMBULATORY_CARE_PROVIDER_SITE_OTHER): Payer: BC Managed Care – PPO | Admitting: *Deleted

## 2019-08-09 DIAGNOSIS — M722 Plantar fascial fibromatosis: Secondary | ICD-10-CM

## 2019-08-09 NOTE — Progress Notes (Signed)
Patient presents for the 1st EPAT treatment today with complaint of plantar heel pain left. Diagnosed with plantar fasciitis by Dr. Ardelle Anton. This has been ongoing for several months. The patient has tried ice, stretching, NSAIDS and supportive shoe gear with no long term relief. Most of the pain is located central and medial heel.   ESWT administered and tolerated well. Treatment settings initiated at:   Energy: 10 for 2000 shocks  Patient was extremely sensitive when treatment was initiated, so energy was kept at a decreased level.  Ended treatment session today with 3000 shocks at the following settings:   Energy: 12  Frequency: 6.0  Joules: 10.89  Arch massager was utilized x 3 rounds  Advised to avoid ice and NSAIDs and to utilize boot or supportive shoes for at least the next 3 days.  Follow up for 2nd treatment in 1 week.  Patient does have plantar heel pain bilateral (L>R), but we will work on the left one for now and start on the right if she feels its necessary.

## 2019-08-09 NOTE — Patient Instructions (Signed)

## 2019-08-16 ENCOUNTER — Other Ambulatory Visit: Payer: Self-pay

## 2019-08-16 ENCOUNTER — Ambulatory Visit (INDEPENDENT_AMBULATORY_CARE_PROVIDER_SITE_OTHER): Payer: BC Managed Care – PPO | Admitting: *Deleted

## 2019-08-16 DIAGNOSIS — M79676 Pain in unspecified toe(s): Secondary | ICD-10-CM

## 2019-08-16 DIAGNOSIS — M722 Plantar fascial fibromatosis: Secondary | ICD-10-CM

## 2019-08-16 NOTE — Progress Notes (Signed)
Patient presents for the 2nd EPAT treatment today with complaint of plantar heel pain left. Diagnosed with plantar fasciitis by Dr. Ardelle Anton. This has been ongoing for several months. The patient has tried ice, stretching, NSAIDS and supportive shoe gear with no long term relief. Most of the pain is located central and medial heel. She has worn the CAM boot all week and says its been very sore. Pain is more located medial and posterior edge of heel.  ESWT administered and tolerated well. Treatment settings initiated at:   Energy: 12  Patient is still sensitive with treatment.  Ended treatment session today with 3000 shocks at the following settings:   Energy: 12  Frequency: 6.0  Joules: 11.77  Arch massager was utilized x 3 rounds  Advised to avoid ice and NSAIDs and to utilize boot or supportive shoes for at least the next 3 days.  Follow up for 2nd treatment in 1 week.  Patient states her right heel is feeling some better.

## 2019-08-23 DIAGNOSIS — U071 COVID-19: Secondary | ICD-10-CM | POA: Insufficient documentation

## 2019-08-30 ENCOUNTER — Other Ambulatory Visit: Payer: BC Managed Care – PPO

## 2019-09-06 ENCOUNTER — Other Ambulatory Visit: Payer: BC Managed Care – PPO

## 2019-10-09 ENCOUNTER — Other Ambulatory Visit: Payer: Self-pay | Admitting: Podiatry

## 2019-10-17 ENCOUNTER — Ambulatory Visit (INDEPENDENT_AMBULATORY_CARE_PROVIDER_SITE_OTHER): Payer: BC Managed Care – PPO | Admitting: Podiatry

## 2019-10-17 ENCOUNTER — Encounter: Payer: Self-pay | Admitting: *Deleted

## 2019-10-17 ENCOUNTER — Encounter: Payer: Self-pay | Admitting: Podiatry

## 2019-10-17 ENCOUNTER — Other Ambulatory Visit: Payer: Self-pay

## 2019-10-17 DIAGNOSIS — E1149 Type 2 diabetes mellitus with other diabetic neurological complication: Secondary | ICD-10-CM

## 2019-10-17 DIAGNOSIS — M722 Plantar fascial fibromatosis: Secondary | ICD-10-CM

## 2019-10-17 DIAGNOSIS — M216X2 Other acquired deformities of left foot: Secondary | ICD-10-CM | POA: Diagnosis not present

## 2019-10-17 DIAGNOSIS — M21862 Other specified acquired deformities of left lower leg: Secondary | ICD-10-CM

## 2019-10-17 DIAGNOSIS — Z01818 Encounter for other preprocedural examination: Secondary | ICD-10-CM | POA: Diagnosis not present

## 2019-10-17 NOTE — Patient Instructions (Signed)
Pre-Operative Instructions  Congratulations, you have decided to take an important step towards improving your quality of life.  You can be assured that the doctors and staff at [facility] will be with you every step of the way.  Here are some important things you should know:  1. Plan to be at the surgery center/hospital at least 1 (one) hour prior to your scheduled time, unless otherwise directed by the surgical center/hospital staff.  You must have a responsible adult accompany you, remain during the surgery and drive you home.  Make sure you have directions to the surgical center/hospital to ensure you arrive on time. 2. If you are having surgery at [facility] or  hospitals, you will need a copy of your medical history and physical form from your family physician within one month prior to the date of surgery. We will give you a form for your primary physician to complete.  3. We make every effort to accommodate the date you request for surgery.  However, there are times where surgery dates or times have to be moved.  We will contact you as soon as possible if a change in schedule is required.   4. No aspirin/ibuprofen for one week before surgery.  If you are on aspirin, any non-steroidal anti-inflammatory medications (Mobic, Aleve, Ibuprofen) should not be taken seven (7) days prior to your surgery.  You make take Tylenol for pain prior to surgery.  5. Medications - If you are taking daily heart and blood pressure medications, seizure, reflux, allergy, asthma, anxiety, pain or diabetes medications, make sure you notify the surgery center/hospital before the day of surgery so they can tell you which medications you should take or avoid the day of surgery. 6. No food or drink after midnight the night before surgery unless directed otherwise by surgical center/hospital staff. 7. No alcoholic beverages 24-hours prior to surgery.  No smoking 24-hours prior or 24-hours after  surgery. 8. Wear loose pants or shorts. They should be loose enough to fit over bandages, boots, and casts. 9. Don't wear slip-on shoes. Sneakers are preferred. 10. Bring your boot with you to the surgery center/hospital.  Also bring crutches or a walker if your physician has prescribed it for you.  If you do not have this equipment, it will be provided for you after surgery. 11. If you have not been contacted by the surgery center/hospital by the day before your surgery, call to confirm the date and time of your surgery. 12. Leave-time from work may vary depending on the type of surgery you have.  Appropriate arrangements should be made prior to surgery with your employer. 13. Prescriptions will be provided immediately following surgery by your doctor.  Fill these as soon as possible after surgery and take the medication as directed. Pain medications will not be refilled on weekends and must be approved by the doctor. 14. Remove nail polish on the operative foot and avoid getting pedicures prior to surgery. 15. Wash the night before surgery.  The night before surgery wash the foot and leg well with water and the antibacterial soap provided. Be sure to pay special attention to beneath the toenails and in between the toes.  Wash for at least three (3) minutes. Rinse thoroughly with water and dry well with a towel.  Perform this wash unless told not to do so by your physician.  Enclosed: 1 Ice pack (please put in freezer the night before surgery)   1 Hibiclens skin cleaner     Pre-op instructions  If you have any questions regarding the instructions, please do not hesitate to call our office.  [place]: [address], [place], [place] [address] -- [phone]  [place]: [address] [place], [place] [address] -- [phone]  [place]: [address], [place], [place] [address] -- [phone]   Website: [email or web address] 

## 2019-10-18 ENCOUNTER — Encounter: Payer: Self-pay | Admitting: Podiatry

## 2019-10-18 ENCOUNTER — Telehealth: Payer: Self-pay

## 2019-10-18 NOTE — Telephone Encounter (Signed)
DOS 10/28/19  EPF LT - 27639 GASTROCNEMIUS RECESS LT - 43200  BCBS EFFECTIVE DATE - 07/11/2013      In-Network   Max Per Benefit Period Year-to-Date Remaining     CoInsurance         Deductible $3,500.00 $3,404.36     Out-Of-Pocket 3 $5,500.00 $5,404.36  Copay Not Applicable Coinsurance 20% Authorization Required No

## 2019-10-18 NOTE — Progress Notes (Signed)
Subjective:  Patient ID: Irving Burton, female    DOB: January 10, 1966,  MRN: [id number]  Chief Complaint  Patient presents with  . Plantar Fasciitis    left foot, heel has started to hurt again,requesting an injection    54 y.o. female presents with the above complaint.  Patient presents with continuous pain from left plantar fasciitis.  She states that the pain is still the same.  Nothing is helped really decrease the pain.  She states that she has been wearing her orthotics which states that she does help her.  She is constantly on her foot working 10 to 12-hour shifts.  She has tried multiple injections.  She is known to Dr. Ardelle Anton and has been actively treating her for plantar fasciitis.  She states the injection does help for a little bit however her pain keeps coming back.  She would has tried all conservative therapy and has failed them all.  At this time she would like to discuss surgical intervention.  She states that she is diabetic with controlled A1c.  She works at [employer].   Review of Systems: Negative except as noted in the HPI. Denies N/V/F/Ch.  Past Medical History:  Diagnosis Date  . Diabetes mellitus without complication (HCC)   . Hypertension   . Neuropathy     Current Outpatient Medications:  .  albuterol (VENTOLIN HFA) 108 (90 Base) MCG/ACT inhaler, , Disp: , Rfl:  .  amLODipine (NORVASC) 5 MG tablet, , Disp: , Rfl:  .  atorvastatin (LIPITOR) 10 MG tablet, Take 10 mg by mouth daily., Disp: , Rfl:  .  atorvastatin (LIPITOR) 40 MG tablet, , Disp: , Rfl:  .  BYDUREON 2 MG PEN, , Disp: , Rfl:  .  DULoxetine (CYMBALTA) 60 MG capsule, , Disp: , Rfl:  .  fluconazole (DIFLUCAN) 150 MG tablet, Take 150 mg by mouth once., Disp: , Rfl:  .  gabapentin (NEURONTIN) 100 MG capsule, Take 100 mg by mouth 3 (three) times daily., Disp: , Rfl:  .  gabapentin (NEURONTIN) 300 MG capsule, , Disp: , Rfl:  .  ibuprofen (ADVIL) 600 MG tablet, Take 1 tablet (600 mg total) by  mouth every 8 (eight) hours as needed., Disp: 30 tablet, Rfl: 0 .  JARDIANCE 10 MG TABS tablet, , Disp: , Rfl:  .  meloxicam (MOBIC) 15 MG tablet, Take 1 tablet (15 mg total) by mouth daily., Disp: 14 tablet, Rfl: 0 .  metFORMIN (GLUCOPHAGE) 500 MG tablet, Take by mouth 2 (two) times daily with a meal., Disp: , Rfl:  .  methocarbamol (ROBAXIN) 750 MG tablet, Take 750 mg by mouth 3 (three) times daily., Disp: , Rfl:  .  OZEMPIC, 0.25 OR 0.5 MG/DOSE, 2 MG/1.5ML SOPN, Inject 0.5 mg into the skin once a week., Disp: , Rfl:  .  pregabalin (LYRICA) 25 MG capsule, Take 25 mg by mouth 3 (three) times daily., Disp: , Rfl:   Social History   Tobacco Use  Smoking Status Never Smoker  Smokeless Tobacco Never Used    Allergies  Allergen Reactions  . Oxycodone-Acetaminophen Nausea Only  . Empagliflozin Other (See Comments)    Vaginal Itching / Yeast Infection   Objective:  There were no vitals filed for this visit. There is no height or weight on file to calculate BMI. Constitutional Well developed. Well nourished.  Vascular Dorsalis pedis pulses palpable bilaterally. Posterior tibial pulses palpable bilaterally. Capillary refill normal to all digits.  No cyanosis or clubbing noted. Pedal  hair growth normal.  Neurologic Normal speech. Oriented to person, place, and time. Epicritic sensation to light touch grossly present bilaterally.  Dermatologic Nails well groomed and normal in appearance. No open wounds. No skin lesions.  Orthopedic: Normal joint ROM without pain or crepitus bilaterally. No visible deformities. Tender to palpation at the calcaneal tuber left. No pain with calcaneal squeeze left. Ankle ROM diminished range of motion left. Silfverskiold Test: positive left.   Radiographs: Previous x-rays were reviewed no acute fractures or dislocations. No evidence of stress fracture.  Plantar heel spur present. Posterior heel spur present.   Assessment:   1. Type II diabetes  mellitus with neurological manifestations ([place])   2. Plantar fasciitis   3. Gastrocnemius equinus of left lower extremity   4. Preop examination    Plan:  Patient was evaluated and treated and all questions answered.  Plantar Fasciitis, left with underlying gastrocnemius equinus deformity -I explained to the patient the etiology of plantar fasciitis and given that she has failed all conservative therapy I believe she will benefit from surgical intervention to help decrease some of the pain associated with it.  I also discussed with her and after performing Silfverskiold test she is positive for gastrin images equinus and is primary the reason why she might not be able to decrease the pain associated with plantar fasciitis.  I discussed with her that she will benefit from endoscopic plantar fasciotomy with gastrocnemius recession.  Patient states understanding would like to proceed with a surgical intervention. -My postop protocols were extensively discussed.  Patient will be weightbearing as tolerated in a cam boot. -Informed surgical risk consent was reviewed and read aloud to the patient.  I reviewed the films.  I have discussed my findings with the patient in great detail.  I have discussed all risks including but not limited to infection, stiffness, scarring, limp, disability, deformity, damage to blood vessels and nerves, numbness, poor healing, need for braces, arthritis, chronic pain, amputation, death.  All benefits and realistic expectations discussed in great detail.  I have made no promises as to the outcome.  I have provided realistic expectations.  I have offered the patient a 2nd opinion, which they have declined and assured me they preferred to proceed despite the risks -I discussed with the patient that given that she is diabetic she has a high risk of wound complication associated with this.  Patient states understanding would like to proceed with the surgery despite the  risks.  Procedure: Injection Tendon/Ligament Location: Left plantar fascia at the glabrous junction; medial approach. Skin Prep: alcohol Injectate: 0.5 cc 0.5% marcaine plain, 0.5 cc of 1% Lidocaine, 0.5 cc kenalog 10. Disposition: Patient tolerated procedure well. Injection site dressed with a band-aid.  No follow-ups on file.

## 2019-10-22 ENCOUNTER — Telehealth: Payer: Self-pay

## 2019-10-22 NOTE — Telephone Encounter (Signed)
Received email from Clayton at [facility] that the patient needed to cancel her surgery due to not enough available PTO time to cover time out of work.

## 2019-11-06 ENCOUNTER — Encounter: Payer: BC Managed Care – PPO | Admitting: Podiatry

## 2019-11-11 ENCOUNTER — Ambulatory Visit: Payer: BC Managed Care – PPO | Admitting: Podiatry

## 2019-11-13 ENCOUNTER — Encounter: Payer: BC Managed Care – PPO | Admitting: Podiatry

## 2019-12-06 ENCOUNTER — Encounter: Payer: BC Managed Care – PPO | Admitting: Podiatry

## 2020-01-01 ENCOUNTER — Ambulatory Visit (INDEPENDENT_AMBULATORY_CARE_PROVIDER_SITE_OTHER): Payer: No Typology Code available for payment source | Admitting: Podiatry

## 2020-01-01 ENCOUNTER — Other Ambulatory Visit: Payer: Self-pay

## 2020-01-01 ENCOUNTER — Ambulatory Visit (INDEPENDENT_AMBULATORY_CARE_PROVIDER_SITE_OTHER): Payer: No Typology Code available for payment source

## 2020-01-01 ENCOUNTER — Encounter: Payer: Self-pay | Admitting: Podiatry

## 2020-01-01 ENCOUNTER — Other Ambulatory Visit: Payer: Self-pay | Admitting: Podiatry

## 2020-01-01 DIAGNOSIS — M722 Plantar fascial fibromatosis: Secondary | ICD-10-CM | POA: Diagnosis not present

## 2020-01-01 DIAGNOSIS — S93491A Sprain of other ligament of right ankle, initial encounter: Secondary | ICD-10-CM

## 2020-01-01 DIAGNOSIS — M79671 Pain in right foot: Secondary | ICD-10-CM | POA: Diagnosis not present

## 2020-01-01 DIAGNOSIS — E1149 Type 2 diabetes mellitus with other diabetic neurological complication: Secondary | ICD-10-CM

## 2020-01-01 NOTE — Progress Notes (Signed)
Subjective:  Patient ID: Erika Elliott, female    DOB: 1966/06/11,  MRN: [id number]  Chief Complaint  Patient presents with  . Foot Injury    Pt states fell off truck last night and injured right foot/ankle. Pt states sharp shooting pain going from foot into ankle, including swelling on the plantar and medial aspect of her foot.  . Plantar Fasciitis    Pt requests steroid injection for left heel pain.    54 y.o. female presents with the above complaint.  Patient presents with a follow-up of left plantar fasciitis with a new acute onset of right plantar fasciitis right ankle sprain.  Patient states that she was trying to get off the truck last night.  Patient states that it is painful to walk on.  There is swelling on the right side.  Patient also states that she has pain in the right foot as well may have aggravated the right foot plantar fasciitis, for now she would like to figure out a time where the left foot surgery but for now she would like injections in both heels.  She denies any other acute complaints.  She still has a boot with her at home.   Review of Systems: Negative except as noted in the HPI. Denies N/V/F/Ch.  Past Medical History:  Diagnosis Date  . Diabetes mellitus without complication ([place])   . Hypertension   . Neuropathy     Current Outpatient Medications:  .  albuterol (VENTOLIN HFA) 108 (90 Base) MCG/ACT inhaler, , Disp: , Rfl:  .  amLODipine (NORVASC) 5 MG tablet, , Disp: , Rfl:  .  atorvastatin (LIPITOR) 10 MG tablet, Take 10 mg by mouth daily., Disp: , Rfl:  .  atorvastatin (LIPITOR) 40 MG tablet, , Disp: , Rfl:  .  BYDUREON 2 MG PEN, , Disp: , Rfl:  .  DULoxetine (CYMBALTA) 60 MG capsule, , Disp: , Rfl:  .  fluconazole (DIFLUCAN) 150 MG tablet, Take 150 mg by mouth once., Disp: , Rfl:  .  gabapentin (NEURONTIN) 100 MG capsule, Take 100 mg by mouth 3 (three) times daily., Disp: , Rfl:  .  gabapentin (NEURONTIN) 300 MG capsule, , Disp: , Rfl:  .  ibuprofen  (ADVIL) 600 MG tablet, Take 1 tablet (600 mg total) by mouth every 8 (eight) hours as needed., Disp: 30 tablet, Rfl: 0 .  JARDIANCE 10 MG TABS tablet, , Disp: , Rfl:  .  meloxicam (MOBIC) 15 MG tablet, Take 1 tablet (15 mg total) by mouth daily., Disp: 14 tablet, Rfl: 0 .  metFORMIN (GLUCOPHAGE) 500 MG tablet, Take by mouth 2 (two) times daily with a meal., Disp: , Rfl:  .  methocarbamol (ROBAXIN) 750 MG tablet, Take 750 mg by mouth 3 (three) times daily., Disp: , Rfl:  .  OZEMPIC, 0.25 OR 0.5 MG/DOSE, 2 MG/1.5ML SOPN, Inject 0.5 mg into the skin once a week., Disp: , Rfl:  .  pregabalin (LYRICA) 25 MG capsule, Take 25 mg by mouth 3 (three) times daily., Disp: , Rfl:   Social History   Tobacco Use  Smoking Status Never Smoker  Smokeless Tobacco Never Used    Allergies  Allergen Reactions  . Oxycodone-Acetaminophen Nausea Only  . Empagliflozin Other (See Comments)    Vaginal Itching / Yeast Infection   Objective:  There were no vitals filed for this visit. There is no height or weight on file to calculate BMI. Constitutional Well developed. Well nourished.  Vascular Dorsalis pedis pulses palpable bilaterally.  Posterior tibial pulses palpable bilaterally. Capillary refill normal to all digits.  No cyanosis or clubbing noted. Pedal hair growth normal.  Neurologic Normal speech. Oriented to person, place, and time. Epicritic sensation to light touch grossly present bilaterally.  Dermatologic Nails well groomed and normal in appearance. No open wounds. No skin lesions.  Orthopedic: Normal joint ROM without pain or crepitus bilaterally. No visible deformities. Tender to palpation at the calcaneal tuber left. No pain with calcaneal squeeze left. Ankle ROM diminished range of motion left. Silfverskiold Test: positive left.  Pain on palpation to the calcaneal tuber to the right side.  No pain with calcaneal squeeze test.  Diminished ankle range of motion right side as well.   Positive Silfverskiold on the right as well.  Pain on palpation to the right ATFL ligament.  Pain with plantarflexion and inversion of the foot passive.  Pain with resisted dorsiflexion eversion of the foot.  No pain at the Achilles tendon, posterior tibial, peroneal tendon bilaterally.   Radiographs: Previous x-rays were reviewed no acute fractures or dislocations. No evidence of stress fracture.  Plantar heel spur present. Posterior heel spur present.   Assessment:   1. Pain in right foot   2. Type II diabetes mellitus with neurological manifestations (HCC)   3. Plantar fasciitis of left foot   4. Plantar fasciitis of right foot   5. Sprain of anterior talofibular ligament of right ankle, initial encounter    Plan:  Patient was evaluated and treated and all questions answered.  Right ankle sprain grade 1 -I explained to the patient the etiology of ankle sprain and various treatment options were extensively discussed with the patient.  I believe patient will benefit from cam boot immobilization to completely immobilize the ankle and therefore allow her to properly heal.  This will also indirectly help with the right heel plantar fasciitis as well. -She will place her self back in a cam boot to the right side.   Plantar Fasciitis, bilaterally - XR reviewed as above.  - Educated on icing and stretching. Instructions given.  - Injection delivered to the plantar fascia as below. - DME: I have asked her to put herself in a cam boot for the right side with the ankle sprain as well as plantar fasciitis - Pharmacologic management: None  Procedure: Injection Tendon/Ligament Location: Left plantar fascia at the glabrous junction; medial approach. Skin Prep: alcohol Injectate: 0.5 cc 0.5% marcaine plain, 0.5 cc of 1% Lidocaine, 0.5 cc kenalog 10. Disposition: Patient tolerated procedure well. Injection site dressed with a band-aid.  No follow-ups on file.

## 2020-01-02 ENCOUNTER — Other Ambulatory Visit: Payer: Self-pay | Admitting: Podiatry

## 2020-01-02 DIAGNOSIS — S93491A Sprain of other ligament of right ankle, initial encounter: Secondary | ICD-10-CM

## 2020-01-29 ENCOUNTER — Ambulatory Visit (INDEPENDENT_AMBULATORY_CARE_PROVIDER_SITE_OTHER): Payer: No Typology Code available for payment source | Admitting: Podiatry

## 2020-01-29 ENCOUNTER — Other Ambulatory Visit: Payer: Self-pay

## 2020-01-29 DIAGNOSIS — M722 Plantar fascial fibromatosis: Secondary | ICD-10-CM

## 2020-01-29 DIAGNOSIS — M778 Other enthesopathies, not elsewhere classified: Secondary | ICD-10-CM | POA: Diagnosis not present

## 2020-01-29 DIAGNOSIS — M79671 Pain in right foot: Secondary | ICD-10-CM | POA: Diagnosis not present

## 2020-01-29 DIAGNOSIS — M7730 Calcaneal spur, unspecified foot: Secondary | ICD-10-CM | POA: Diagnosis not present

## 2020-01-29 DIAGNOSIS — E1149 Type 2 diabetes mellitus with other diabetic neurological complication: Secondary | ICD-10-CM

## 2020-01-30 NOTE — Progress Notes (Signed)
Subjective:  Patient ID: Erika Elliott, female    DOB: 07-09-1966,  MRN: [id number]  Chief Complaint  Patient presents with  . Foot Pain    pt is here for right foot pain f/u    54 y.o. female presents with the above complaint.  Patient presents with a follow-up of right plantar fasciitis as well as right lateral ankle sprain.  Patient states the cam boot did help however she is not able to transition out of the boot as it continues to hurt.  She states the injection worked for a little bit until it wore off.  She also states that the left plantar fasciitis is started acting back up again likely due to combination saline with a cam boot on the right side.  She would like to know if she could get it treated on the left foot.  She denies any other acute complaints.  She would like to know what the next treatment options are.  Her pain scale 7 out of 10.  Patient is a type II diabetic with last A1c of that is unknown.  Review of Systems: Negative except as noted in the HPI. Denies N/V/F/Ch.  Past Medical History:  Diagnosis Date  . Diabetes mellitus without complication (HCC)   . Hypertension   . Neuropathy     Current Outpatient Medications:  .  albuterol (VENTOLIN HFA) 108 (90 Base) MCG/ACT inhaler, , Disp: , Rfl:  .  amLODipine (NORVASC) 5 MG tablet, , Disp: , Rfl:  .  atorvastatin (LIPITOR) 10 MG tablet, Take 10 mg by mouth daily., Disp: , Rfl:  .  atorvastatin (LIPITOR) 40 MG tablet, , Disp: , Rfl:  .  BYDUREON 2 MG PEN, , Disp: , Rfl:  .  DULoxetine (CYMBALTA) 60 MG capsule, , Disp: , Rfl:  .  fluconazole (DIFLUCAN) 150 MG tablet, Take 150 mg by mouth once., Disp: , Rfl:  .  gabapentin (NEURONTIN) 100 MG capsule, Take 100 mg by mouth 3 (three) times daily., Disp: , Rfl:  .  gabapentin (NEURONTIN) 300 MG capsule, , Disp: , Rfl:  .  ibuprofen (ADVIL) 600 MG tablet, Take 1 tablet (600 mg total) by mouth every 8 (eight) hours as needed., Disp: 30 tablet, Rfl: 0 .  JARDIANCE 10 MG TABS  tablet, , Disp: , Rfl:  .  losartan (COZAAR) 50 MG tablet, Take by mouth., Disp: , Rfl:  .  meloxicam (MOBIC) 15 MG tablet, Take 1 tablet (15 mg total) by mouth daily., Disp: 14 tablet, Rfl: 0 .  metFORMIN (GLUCOPHAGE) 500 MG tablet, Take by mouth 2 (two) times daily with a meal., Disp: , Rfl:  .  methocarbamol (ROBAXIN) 750 MG tablet, Take 750 mg by mouth 3 (three) times daily., Disp: , Rfl:  .  OZEMPIC, 0.25 OR 0.5 MG/DOSE, 2 MG/1.5ML SOPN, Inject 0.5 mg into the skin once a week., Disp: , Rfl:  .  pregabalin (LYRICA) 25 MG capsule, Take 25 mg by mouth 3 (three) times daily., Disp: , Rfl:   Social History   Tobacco Use  Smoking Status Never Smoker  Smokeless Tobacco Never Used    Allergies  Allergen Reactions  . Oxycodone-Acetaminophen Nausea Only  . Empagliflozin Other (See Comments)    Vaginal Itching / Yeast Infection   Objective:  There were no vitals filed for this visit. There is no height or weight on file to calculate BMI. Constitutional Well developed. Well nourished.  Vascular Dorsalis pedis pulses palpable bilaterally. Posterior tibial pulses palpable  bilaterally. Capillary refill normal to all digits.  No cyanosis or clubbing noted. Pedal hair growth normal.  Neurologic Normal speech. Oriented to person, place, and time. Epicritic sensation to light touch grossly present bilaterally.  Dermatologic Nails well groomed and normal in appearance. No open wounds. No skin lesions.  Orthopedic: Normal joint ROM without pain or crepitus bilaterally. No visible deformities. Tender to palpation at the calcaneal tuber left. No pain with calcaneal squeeze left. Ankle ROM diminished range of motion left. Silfverskiold Test: positive left.  Pain on palpation to the calcaneal tuber to the right side.  No pain with calcaneal squeeze test.  Diminished ankle range of motion right side as well.  Positive Silfverskiold on the right as well.   Pain on palpation to the right ATFL  ligament.  Pain with plantarflexion and inversion of the foot passive.  Pain with resisted dorsiflexion eversion of the foot.  No pain at the Achilles tendon, posterior tibial, peroneal tendon bilaterally.   Radiographs: Previous x-rays were reviewed no acute fractures or dislocations. No evidence of stress fracture.  Plantar heel spur present. Posterior heel spur present.   Assessment:   No diagnosis found. Plan:  Patient was evaluated and treated and all questions answered.  Right ankle sprain grade 1 -I explained to the patient the etiology of ankle sprain and various treatment options were extensively discussed with the patient.  -Clinically patient's pain does improve in the cam boot however she is not able to transition out of the cam boot.  Her pain returns right away.  At this point I believe patient will benefit from MRI evaluation to rule out any tearing or osteochondral lesion.  Patient agrees with the plan.  I also believe she will benefit from a steroid injection given the acute pain that she is having in the region.  Patient agrees with the plan like to proceed with a steroid injection -A steroid injection was performed at right lateral ankle using 1% plain Lidocaine and 10 mg of Kenalog. This was well tolerated.  Plantar Fasciitis, right - XR reviewed as above.  - Educated on icing and stretching. Instructions given.  -Second injection delivered to the plantar fascia as below. - DME: Tri-Lock ankle brace - Pharmacologic management: None -I believe patient will also benefit from MRI evaluation of the plantar fascia as well to rule out any kind of tearing of it.  Patient states understanding.  Left plantar fasciitis -It appears that the left side of the plantar fascia has acted back up after being in the cam boot.  I believe patient will benefit from a steroid injection to take the stress of the plantar fascia.  Patient agrees with plan would like to proceed with a steroid  injection.  I believe this is likely compensatory however clinically given that she is having a lot of pain I believe she will benefit from steroid injection. -A steroid injection was performed at left plantar heel using 1% plain Lidocaine and 10 mg of Kenalog. This was well tolerated.   Procedure: Injection Tendon/Ligament Location: Left plantar fascia at the glabrous junction; medial approach. Skin Prep: alcohol Injectate: 0.5 cc 0.5% marcaine plain, 0.5 cc of 1% Lidocaine, 0.5 cc kenalog 10. Disposition: Patient tolerated procedure well. Injection site dressed with a band-aid.  No follow-ups on file.

## 2020-01-31 ENCOUNTER — Telehealth: Payer: Self-pay | Admitting: *Deleted

## 2020-01-31 ENCOUNTER — Other Ambulatory Visit: Payer: Self-pay | Admitting: Podiatry

## 2020-01-31 DIAGNOSIS — M79671 Pain in right foot: Secondary | ICD-10-CM

## 2020-01-31 DIAGNOSIS — M722 Plantar fascial fibromatosis: Secondary | ICD-10-CM

## 2020-01-31 DIAGNOSIS — S93491A Sprain of other ligament of right ankle, initial encounter: Secondary | ICD-10-CM

## 2020-01-31 DIAGNOSIS — M7730 Calcaneal spur, unspecified foot: Secondary | ICD-10-CM

## 2020-01-31 NOTE — Telephone Encounter (Signed)
-----   Message from Candelaria Stagers, DPM sent at 01/30/2020 10:33 AM EDT ----- Regarding: MRI right ankle Hi Kenric Ginger,  Which I am able to order MRI of the right ankle/heel to rule out tearing in the plantar fascia as well as tearing of the lateral ligament.   Thank you

## 2020-01-31 NOTE — Telephone Encounter (Signed)
Faxed orders, clinicals and demographics to [facility] for pre-cert and scheduling. 

## 2020-02-25 ENCOUNTER — Other Ambulatory Visit: Payer: Self-pay

## 2020-02-25 ENCOUNTER — Ambulatory Visit
Admission: RE | Admit: 2020-02-25 | Discharge: 2020-02-25 | Disposition: A | Discharge status: Home / Self Care | Payer: No Typology Code available for payment source | Source: Ambulatory Visit | Attending: Podiatry | Admitting: Podiatry

## 2020-02-28 ENCOUNTER — Other Ambulatory Visit: Payer: Self-pay

## 2020-02-28 ENCOUNTER — Ambulatory Visit (INDEPENDENT_AMBULATORY_CARE_PROVIDER_SITE_OTHER): Payer: No Typology Code available for payment source | Admitting: Podiatry

## 2020-02-28 DIAGNOSIS — M722 Plantar fascial fibromatosis: Secondary | ICD-10-CM

## 2020-02-28 DIAGNOSIS — S93491A Sprain of other ligament of right ankle, initial encounter: Secondary | ICD-10-CM | POA: Diagnosis not present

## 2020-03-03 ENCOUNTER — Encounter: Payer: Self-pay | Admitting: Podiatry

## 2020-03-03 NOTE — Progress Notes (Signed)
Subjective:  Patient ID: Erika Elliott, female    DOB: 03/18/1966,  MRN: [id number]  No chief complaint on file.   54 y.o. female presents with the above complaint.  Patient presents with a follow-up of right plantar fasciitis as well as bilateral ankle pain.  Patient is here to discuss the MRI with was discussed in very detail.  Her pain on the right ankle has improved considerably.  She is managing it fine with Tri-Lock ankle brace.  She states the plantar fasciitis pain pain is still very painful.  She denies any other acute complaints.  She would like to do another steroid injection there.  As it does help.  Review of Systems: Negative except as noted in the HPI. Denies N/V/F/Ch.  Past Medical History:  Diagnosis Date  . Diabetes mellitus without complication (HCC)   . Hypertension   . Neuropathy     Current Outpatient Medications:  .  albuterol (VENTOLIN HFA) 108 (90 Base) MCG/ACT inhaler, , Disp: , Rfl:  .  amLODipine (NORVASC) 5 MG tablet, , Disp: , Rfl:  .  atorvastatin (LIPITOR) 10 MG tablet, Take 10 mg by mouth daily., Disp: , Rfl:  .  atorvastatin (LIPITOR) 40 MG tablet, , Disp: , Rfl:  .  BYDUREON 2 MG PEN, , Disp: , Rfl:  .  DULoxetine (CYMBALTA) 60 MG capsule, , Disp: , Rfl:  .  fluconazole (DIFLUCAN) 150 MG tablet, Take 150 mg by mouth once., Disp: , Rfl:  .  gabapentin (NEURONTIN) 100 MG capsule, Take 100 mg by mouth 3 (three) times daily., Disp: , Rfl:  .  gabapentin (NEURONTIN) 300 MG capsule, , Disp: , Rfl:  .  ibuprofen (ADVIL) 600 MG tablet, Take 1 tablet (600 mg total) by mouth every 8 (eight) hours as needed., Disp: 30 tablet, Rfl: 0 .  JARDIANCE 10 MG TABS tablet, , Disp: , Rfl:  .  losartan (COZAAR) 50 MG tablet, Take by mouth., Disp: , Rfl:  .  metFORMIN (GLUCOPHAGE) 500 MG tablet, Take by mouth 2 (two) times daily with a meal., Disp: , Rfl:  .  methocarbamol (ROBAXIN) 750 MG tablet, Take 750 mg by mouth 3 (three) times daily., Disp: , Rfl:  .  OZEMPIC, 0.25  OR 0.5 MG/DOSE, 2 MG/1.5ML SOPN, Inject 0.5 mg into the skin once a week., Disp: , Rfl:  .  pregabalin (LYRICA) 25 MG capsule, Take 25 mg by mouth 3 (three) times daily., Disp: , Rfl:   Social History   Tobacco Use  Smoking Status Never Smoker  Smokeless Tobacco Never Used    Allergies  Allergen Reactions  . Oxycodone-Acetaminophen Nausea Only  . Empagliflozin Other (See Comments)    Vaginal Itching / Yeast Infection   Objective:  There were no vitals filed for this visit. There is no height or weight on file to calculate BMI. Constitutional Well developed. Well nourished.  Vascular Dorsalis pedis pulses palpable bilaterally. Posterior tibial pulses palpable bilaterally. Capillary refill normal to all digits.  No cyanosis or clubbing noted. Pedal hair growth normal.  Neurologic Normal speech. Oriented to person, place, and time. Epicritic sensation to light touch grossly present bilaterally.  Dermatologic Nails well groomed and normal in appearance. No open wounds. No skin lesions.  Orthopedic: Normal joint ROM without pain or crepitus bilaterally. No visible deformities. Tender to palpation at the calcaneal tuber left. No pain with calcaneal squeeze left. Ankle ROM diminished range of motion left. Silfverskiold Test: positive left.  Pain on palpation to the  calcaneal tuber to the right side.  No pain with calcaneal squeeze test.  Diminished ankle range of motion right side as well.  Positive Silfverskiold on the right as well.   No pain on palpation to the right ATFL ligament.  Mild pain with plantarflexion and inversion of the foot passive.  Mild pain with resisted dorsiflexion eversion of the foot.  No pain at the Achilles tendon, posterior tibial, peroneal tendon bilaterally.   Radiographs: Previous x-rays were reviewed no acute fractures or dislocations. No evidence of stress fracture.  Plantar heel spur present. Posterior heel spur present.   Assessment:   1.  Plantar fasciitis of right foot   2. Sprain of anterior talofibular ligament of right ankle, initial encounter    Plan:  Patient was evaluated and treated and all questions answered.  Right ankle sprain grade 1 -Clinically has improved considerably after getting an MRI.  Patient will just manage it primarily with Tri-Lock ankle brace.  If her pain recurs or get worse then will discuss surgical options at that time.  I discussed the MRI findings with her in extensive detail.  Given that she has had improvement I will hold off on any kind of surgical intervention for now.  Plantar Fasciitis, right - XR reviewed as above.  - Educated on icing and stretching. Instructions given.  -Second injection delivered to the plantar fascia as below. - DME: Tri-Lock ankle brace - Pharmacologic management: None -  Left plantar fasciitis -Clinically resolved on the left side with injection in cam boot immobilization.   Procedure: Injection Tendon/Ligament Location: Left plantar fascia at the glabrous junction; medial approach. Skin Prep: alcohol Injectate: 0.5 cc 0.5% marcaine plain, 0.5 cc of 1% Lidocaine, 0.5 cc kenalog 10. Disposition: Patient tolerated procedure well. Injection site dressed with a band-aid.  No follow-ups on file.

## 2020-04-10 ENCOUNTER — Ambulatory Visit: Payer: No Typology Code available for payment source | Admitting: Podiatry

## 2020-04-17 ENCOUNTER — Ambulatory Visit: Payer: No Typology Code available for payment source | Admitting: Podiatry

## 2020-04-22 ENCOUNTER — Encounter: Payer: Self-pay | Admitting: Podiatry

## 2020-04-22 ENCOUNTER — Other Ambulatory Visit: Payer: Self-pay

## 2020-04-22 ENCOUNTER — Ambulatory Visit (INDEPENDENT_AMBULATORY_CARE_PROVIDER_SITE_OTHER): Payer: No Typology Code available for payment source | Admitting: Podiatry

## 2020-04-22 DIAGNOSIS — S93491A Sprain of other ligament of right ankle, initial encounter: Secondary | ICD-10-CM | POA: Diagnosis not present

## 2020-04-22 DIAGNOSIS — M722 Plantar fascial fibromatosis: Secondary | ICD-10-CM

## 2020-04-22 NOTE — Progress Notes (Signed)
Subjective:  Patient ID: Erika Elliott, female    DOB: [birth year],  MRN: [id number]  Chief Complaint  Patient presents with  . Plantar Fasciitis    Pt states continued improvement with tri-lock brace,  no new concerns.    54 y.o. female presents with the above complaint.  Patient presents with a follow-up of right plantar fasciitis as well as bilateral ankle pain.  Patient is here to discuss the MRI with was discussed in very detail.  Her pain on the right ankle has improved considerably.  She is managing it fine with Tri-Lock ankle brace.  She states the plantar fasciitis pain pain is still very painful.  She denies any other acute complaints.  She would like to do another steroid injection there.  As it does help.  Review of Systems: Negative except as noted in the HPI. Denies N/V/F/Ch.  Past Medical History:  Diagnosis Date  . Diabetes mellitus without complication (HCC)   . Hypertension   . Neuropathy     Current Outpatient Medications:  .  albuterol (VENTOLIN HFA) 108 (90 Base) MCG/ACT inhaler, , Disp: , Rfl:  .  amLODipine (NORVASC) 5 MG tablet, , Disp: , Rfl:  .  atorvastatin (LIPITOR) 10 MG tablet, Take 10 mg by mouth daily., Disp: , Rfl:  .  atorvastatin (LIPITOR) 40 MG tablet, , Disp: , Rfl:  .  BYDUREON 2 MG PEN, , Disp: , Rfl:  .  DULoxetine (CYMBALTA) 60 MG capsule, , Disp: , Rfl:  .  fluconazole (DIFLUCAN) 150 MG tablet, Take 150 mg by mouth once., Disp: , Rfl:  .  gabapentin (NEURONTIN) 100 MG capsule, Take 100 mg by mouth 3 (three) times daily., Disp: , Rfl:  .  gabapentin (NEURONTIN) 300 MG capsule, , Disp: , Rfl:  .  ibuprofen (ADVIL) 600 MG tablet, Take 1 tablet (600 mg total) by mouth every 8 (eight) hours as needed., Disp: 30 tablet, Rfl: 0 .  JARDIANCE 10 MG TABS tablet, , Disp: , Rfl:  .  losartan (COZAAR) 50 MG tablet, Take by mouth., Disp: , Rfl:  .  metFORMIN (GLUCOPHAGE) 500 MG tablet, Take by mouth 2 (two) times daily with a meal., Disp: , Rfl:  .   methocarbamol (ROBAXIN) 750 MG tablet, Take 750 mg by mouth 3 (three) times daily., Disp: , Rfl:  .  OZEMPIC, 0.25 OR 0.5 MG/DOSE, 2 MG/1.5ML SOPN, Inject 0.5 mg into the skin once a week., Disp: , Rfl:  .  pregabalin (LYRICA) 25 MG capsule, Take 25 mg by mouth 3 (three) times daily., Disp: , Rfl:   Social History   Tobacco Use  Smoking Status Never Smoker  Smokeless Tobacco Never Used    Allergies  Allergen Reactions  . Oxycodone-Acetaminophen Nausea Only  . Empagliflozin Other (See Comments)    Vaginal Itching / Yeast Infection   Objective:  There were no vitals filed for this visit. There is no height or weight on file to calculate BMI. Constitutional Well developed. Well nourished.  Vascular Dorsalis pedis pulses palpable bilaterally. Posterior tibial pulses palpable bilaterally. Capillary refill normal to all digits.  No cyanosis or clubbing noted. Pedal hair growth normal.  Neurologic Normal speech. Oriented to person, place, and time. Epicritic sensation to light touch grossly present bilaterally.  Dermatologic Nails well groomed and normal in appearance. No open wounds. No skin lesions.  Orthopedic: Normal joint ROM without pain or crepitus bilaterally. No visible deformities. Tender to palpation at the calcaneal tuber left. No pain with  calcaneal squeeze left. Ankle ROM diminished range of motion left. Silfverskiold Test: positive left.  Pain on palpation to the calcaneal tuber to the right side.  No pain with calcaneal squeeze test.  Diminished ankle range of motion right side as well.  Positive Silfverskiold on the right as well.   No pain on palpation to the right ATFL ligament.  Mild pain with plantarflexion and inversion of the foot passive.  Mild pain with resisted dorsiflexion eversion of the foot.  No pain at the Achilles tendon, posterior tibial, peroneal tendon bilaterally.   Radiographs: Previous x-rays were reviewed no acute fractures or dislocations.  No evidence of stress fracture.  Plantar heel spur present. Posterior heel spur present.   Assessment:   1. Sprain of anterior talofibular ligament of right ankle, initial encounter   2. Plantar fasciitis of right foot   3. Plantar fasciitis of left foot    Plan:  Patient was evaluated and treated and all questions answered.  Right ankle sprain grade 1 -Clinically has improved considerably after getting an MRI.  Patient will just manage it primarily with Tri-Lock ankle brace.  If her pain recurs or get worse then will discuss surgical options at that time.  -MRI was discussed in extensive detail appears the MRI was pretty much negative for any kind of right ankle pain or any kind of tearing associated with it.  MRI did pick up mild component of possible plantar fasciitis.  Given that there is improvement going on I will hold off on treating it surgically at this time.  Patient states understanding  Plantar Fasciitis, right - XR reviewed as above.  - Educated on icing and stretching. Instructions given.  -Third injection delivered to the plantar fascia as below. - DME: Tri-Lock ankle brace with plantar fascial brace dispensed x2 - Pharmacologic management: None -  Left plantar fasciitis -Seems to be coming back.  I discussed with the patient the clinical recurrence of this.  I believe that patient will benefit from another steroid injection given that the pain is still very mild in nature.  Patient states understanding would like to proceed with steroid injection   Procedure: Injection Tendon/Ligament Location: Bilateral plantar fascia at the glabrous junction; medial approach. Skin Prep: alcohol Injectate: 0.5 cc 0.5% marcaine plain, 0.5 cc of 1% Lidocaine, 0.5 cc kenalog 10. Disposition: Patient tolerated procedure well. Injection site dressed with a band-aid.  No follow-ups on file.

## 2020-06-03 ENCOUNTER — Other Ambulatory Visit: Payer: Self-pay

## 2020-06-03 ENCOUNTER — Ambulatory Visit (INDEPENDENT_AMBULATORY_CARE_PROVIDER_SITE_OTHER): Payer: No Typology Code available for payment source | Admitting: Podiatry

## 2020-06-03 DIAGNOSIS — M722 Plantar fascial fibromatosis: Secondary | ICD-10-CM

## 2020-06-03 DIAGNOSIS — S93491A Sprain of other ligament of right ankle, initial encounter: Secondary | ICD-10-CM

## 2020-06-09 ENCOUNTER — Encounter: Payer: Self-pay | Admitting: Podiatry

## 2020-06-09 MED ORDER — TRIAMCINOLONE ACETONIDE 10 MG/ML IJ SUSP
10.0000 mg | Freq: Once | INTRAMUSCULAR | Status: AC
  Administered 2020-06-09: 10 mg via INTRA_ARTICULAR

## 2020-06-09 NOTE — Progress Notes (Signed)
Subjective:  Patient ID: Erika Elliott, female    DOB: 08-31-1965,  MRN: [id number]  Chief Complaint  Patient presents with  . Foot Pain    Pt stated that she is doing better she is still having some pain in her foot     54 y.o. female presents with the above complaint.  Patient presents with follow-up of bilateral plantar fasciitis.  Patient states that the pain started coming back again.  He has received about the 2 injections to each of the heel.  I discussed with him the importance of doing no more than 3 injections.  Given her that previous injection had few months after my injection it appears that the injections are helping.  She denies any other acute complaints.  She would like to do another steroid shot.  She also states that her ankle sprain is doing a lot better with a Tri-Lock ankle brace.  Review of Systems: Negative except as noted in the HPI. Denies N/V/F/Ch.  Past Medical History:  Diagnosis Date  . Diabetes mellitus without complication (HCC)   . Hypertension   . Neuropathy     Current Outpatient Medications:  .  insulin glargine (LANTUS) 100 UNIT/ML Solostar Pen, Inject into the skin., Disp: , Rfl:  .  Insulin Pen Needle (BD ULTRA-FINE PEN NEEDLES) 29G X 12.7MM MISC, Use on lantus pen to inject 10 units every evening or as directed., Disp: , Rfl:  .  albuterol (VENTOLIN HFA) 108 (90 Base) MCG/ACT inhaler, , Disp: , Rfl:  .  amLODipine (NORVASC) 10 MG tablet, Take 10 mg by mouth daily., Disp: , Rfl:  .  amLODipine (NORVASC) 5 MG tablet, , Disp: , Rfl:  .  atorvastatin (LIPITOR) 10 MG tablet, Take 10 mg by mouth daily., Disp: , Rfl:  .  atorvastatin (LIPITOR) 40 MG tablet, , Disp: , Rfl:  .  BYDUREON 2 MG PEN, , Disp: , Rfl:  .  DULoxetine (CYMBALTA) 30 MG capsule, Take 30 mg by mouth 2 (two) times daily., Disp: , Rfl:  .  DULoxetine (CYMBALTA) 60 MG capsule, , Disp: , Rfl:  .  fluconazole (DIFLUCAN) 150 MG tablet, Take 150 mg by mouth once., Disp: , Rfl:  .   gabapentin (NEURONTIN) 100 MG capsule, Take 100 mg by mouth 3 (three) times daily., Disp: , Rfl:  .  gabapentin (NEURONTIN) 300 MG capsule, , Disp: , Rfl:  .  ibuprofen (ADVIL) 600 MG tablet, Take 1 tablet (600 mg total) by mouth every 8 (eight) hours as needed., Disp: 30 tablet, Rfl: 0 .  JARDIANCE 10 MG TABS tablet, , Disp: , Rfl:  .  losartan (COZAAR) 50 MG tablet, Take by mouth., Disp: , Rfl:  .  metFORMIN (GLUCOPHAGE) 500 MG tablet, Take by mouth 2 (two) times daily with a meal., Disp: , Rfl:  .  methocarbamol (ROBAXIN) 750 MG tablet, Take 750 mg by mouth 3 (three) times daily., Disp: , Rfl:  .  OZEMPIC, 0.25 OR 0.5 MG/DOSE, 2 MG/1.5ML SOPN, Inject 0.5 mg into the skin once a week., Disp: , Rfl:  .  pregabalin (LYRICA) 25 MG capsule, Take 25 mg by mouth 3 (three) times daily., Disp: , Rfl:   Social History   Tobacco Use  Smoking Status Never Smoker  Smokeless Tobacco Never Used    Allergies  Allergen Reactions  . Oxycodone-Acetaminophen Nausea Only  . Empagliflozin Other (See Comments)    Vaginal Itching / Yeast Infection   Objective:  There were no vitals  filed for this visit. There is no height or weight on file to calculate BMI. Constitutional Well developed. Well nourished.  Vascular Dorsalis pedis pulses palpable bilaterally. Posterior tibial pulses palpable bilaterally. Capillary refill normal to all digits.  No cyanosis or clubbing noted. Pedal hair growth normal.  Neurologic Normal speech. Oriented to person, place, and time. Epicritic sensation to light touch grossly present bilaterally.  Dermatologic Nails well groomed and normal in appearance. No open wounds. No skin lesions.  Orthopedic: Normal joint ROM without pain or crepitus bilaterally. No visible deformities. Tender to palpation at the calcaneal tuber left. No pain with calcaneal squeeze left. Ankle ROM diminished range of motion left. Silfverskiold Test: positive left.  Pain on palpation to the  calcaneal tuber to the right side.  No pain with calcaneal squeeze test.  Diminished ankle range of motion right side as well.  Positive Silfverskiold on the right as well.   No pain on palpation to the right ATFL ligament.  Mild pain with plantarflexion and inversion of the foot passive.  Mild pain with resisted dorsiflexion eversion of the foot.  No pain at the Achilles tendon, posterior tibial, peroneal tendon bilaterally.   Radiographs: Previous x-rays were reviewed no acute fractures or dislocations. No evidence of stress fracture.  Plantar heel spur present. Posterior heel spur present.   Assessment:   1. Sprain of anterior talofibular ligament of right ankle, initial encounter   2. Plantar fasciitis of right foot   3. Plantar fasciitis of left foot    Plan:  Patient was evaluated and treated and all questions answered.  Right ankle sprain grade 1 -Clinically improving with Tri-Lock ankle brace.  I considered/discussed with her the importance of continued wearing Tri-Lock ankle brace for stability.  Plantar Fasciitis, right - XR reviewed as above.  - Educated on icing and stretching. Instructions given.  - injection delivered to the plantar fascia as below. - DME: Continue wearing plantar fascial braces - Pharmacologic management: None -  Left plantar fasciitis -Seems to be coming back.  I discussed with the patient the clinical recurrence of this.  I believe that patient will benefit from another steroid injection given that the pain is still very mild in nature.  Patient states understanding would like to proceed with steroid injection   Procedure: Injection Tendon/Ligament Location: Bilateral plantar fascia at the glabrous junction; medial approach. Skin Prep: alcohol Injectate: 0.5 cc 0.5% marcaine plain, 0.5 cc of 1% Lidocaine, 0.5 cc kenalog 10. Disposition: Patient tolerated procedure well. Injection site dressed with a band-aid.  No follow-ups on file.

## 2020-07-01 ENCOUNTER — Ambulatory Visit: Payer: No Typology Code available for payment source | Admitting: Podiatry

## 2020-10-26 ENCOUNTER — Telehealth: Payer: Self-pay | Admitting: Podiatry

## 2020-10-26 NOTE — Telephone Encounter (Signed)
Spoke with patient regarding insurance coverage, patient is suppose to check information and return call

## 2020-11-06 ENCOUNTER — Encounter: Payer: Self-pay | Admitting: Podiatry

## 2020-11-06 ENCOUNTER — Ambulatory Visit (INDEPENDENT_AMBULATORY_CARE_PROVIDER_SITE_OTHER): Payer: No Typology Code available for payment source | Admitting: Podiatry

## 2020-11-06 ENCOUNTER — Other Ambulatory Visit: Payer: Self-pay

## 2020-11-06 DIAGNOSIS — M7730 Calcaneal spur, unspecified foot: Secondary | ICD-10-CM | POA: Diagnosis not present

## 2020-11-06 DIAGNOSIS — M722 Plantar fascial fibromatosis: Secondary | ICD-10-CM

## 2020-11-06 NOTE — Progress Notes (Signed)
Subjective:  Patient ID: Erika Elliott, female    DOB: [birth year],  MRN: [id number]  Chief Complaint  Patient presents with  . Foot Pain    PT stated that she is still having pain and swelling and would like another injection    55 y.o. female presents with the above complaint.  Patient presents with a follow-up of bilateral plantar fasciitis.  She states that the injection lasted for many months but she started to feel the pain come back.  She would like to do another steroid shot as it been many months.  She denies any other acute complaints.  Review of Systems: Negative except as noted in the HPI. Denies N/V/F/Ch.  Past Medical History:  Diagnosis Date  . Diabetes mellitus without complication (HCC)   . Hypertension   . Neuropathy     Current Outpatient Medications:  .  albuterol (VENTOLIN HFA) 108 (90 Base) MCG/ACT inhaler, , Disp: , Rfl:  .  amLODipine (NORVASC) 10 MG tablet, Take 10 mg by mouth daily., Disp: , Rfl:  .  amLODipine (NORVASC) 5 MG tablet, , Disp: , Rfl:  .  atorvastatin (LIPITOR) 10 MG tablet, Take 10 mg by mouth daily., Disp: , Rfl:  .  atorvastatin (LIPITOR) 40 MG tablet, , Disp: , Rfl:  .  BYDUREON 2 MG PEN, , Disp: , Rfl:  .  DULoxetine (CYMBALTA) 30 MG capsule, Take 30 mg by mouth 2 (two) times daily., Disp: , Rfl:  .  DULoxetine (CYMBALTA) 60 MG capsule, , Disp: , Rfl:  .  fluconazole (DIFLUCAN) 150 MG tablet, Take 150 mg by mouth once., Disp: , Rfl:  .  gabapentin (NEURONTIN) 100 MG capsule, Take 100 mg by mouth 3 (three) times daily., Disp: , Rfl:  .  gabapentin (NEURONTIN) 300 MG capsule, , Disp: , Rfl:  .  ibuprofen (ADVIL) 600 MG tablet, Take 1 tablet (600 mg total) by mouth every 8 (eight) hours as needed., Disp: 30 tablet, Rfl: 0 .  insulin glargine (LANTUS) 100 UNIT/ML Solostar Pen, Inject into the skin., Disp: , Rfl:  .  Insulin Pen Needle (BD ULTRA-FINE PEN NEEDLES) 29G X 12.7MM MISC, Use on lantus pen to inject 10 units every evening or as  directed., Disp: , Rfl:  .  JARDIANCE 10 MG TABS tablet, , Disp: , Rfl:  .  losartan (COZAAR) 50 MG tablet, Take by mouth., Disp: , Rfl:  .  metFORMIN (GLUCOPHAGE) 500 MG tablet, Take by mouth 2 (two) times daily with a meal., Disp: , Rfl:  .  methocarbamol (ROBAXIN) 750 MG tablet, Take 750 mg by mouth 3 (three) times daily., Disp: , Rfl:  .  OZEMPIC, 0.25 OR 0.5 MG/DOSE, 2 MG/1.5ML SOPN, Inject 0.5 mg into the skin once a week., Disp: , Rfl:  .  pregabalin (LYRICA) 25 MG capsule, Take 25 mg by mouth 3 (three) times daily., Disp: , Rfl:   Social History   Tobacco Use  Smoking Status Never Smoker  Smokeless Tobacco Never Used    Allergies  Allergen Reactions  . Oxycodone-Acetaminophen Nausea Only  . Empagliflozin Other (See Comments)    Vaginal Itching / Yeast Infection   Objective:  There were no vitals filed for this visit. There is no height or weight on file to calculate BMI. Constitutional Well developed. Well nourished.  Vascular Dorsalis pedis pulses palpable bilaterally. Posterior tibial pulses palpable bilaterally. Capillary refill normal to all digits.  No cyanosis or clubbing noted. Pedal hair growth normal.  Neurologic Normal  speech. Oriented to person, place, and time. Epicritic sensation to light touch grossly present bilaterally.  Dermatologic Nails well groomed and normal in appearance. No open wounds. No skin lesions.  Orthopedic: Normal joint ROM without pain or crepitus bilaterally. No visible deformities. Tender to palpation at the calcaneal tuber left. No pain with calcaneal squeeze left. Ankle ROM diminished range of motion left. Silfverskiold Test: positive left.  Pain on palpation to the calcaneal tuber to the right side.  No pain with calcaneal squeeze test.  Diminished ankle range of motion right side as well.  Positive Silfverskiold on the right as well.   No pain on palpation to the right ATFL ligament.  Mild pain with plantarflexion and  inversion of the foot passive.  Mild pain with resisted dorsiflexion eversion of the foot.  No pain at the Achilles tendon, posterior tibial, peroneal tendon bilaterally.   Radiographs: Previous x-rays were reviewed no acute fractures or dislocations. No evidence of stress fracture.  Plantar heel spur present. Posterior heel spur present.   Assessment:   1. Plantar fasciitis of right foot   2. Plantar fasciitis of left foot   3. Heel spur, unspecified laterality    Plan:  Patient was evaluated and treated and all questions answered.  Right ankle sprain grade 1 -Clinically improving with Tri-Lock ankle brace.  I considered/discussed with her the importance of continued wearing Tri-Lock ankle brace for stability.  Plantar Fasciitis, right - XR reviewed as above.  - Educated on icing and stretching. Instructions given.  - injection delivered to the plantar fascia as below. - DME: Continue wearing plantar fascial braces - Pharmacologic management: None -  Left plantar fasciitis -Seems to be coming back.  I discussed with the patient the clinical recurrence of this.  I believe that patient will benefit from another steroid injection given that the pain is still very mild in nature.  Patient states understanding would like to proceed with steroid injection   Procedure: Injection Tendon/Ligament Location: Bilateral plantar fascia at the glabrous junction; medial approach. Skin Prep: alcohol Injectate: 0.5 cc 0.5% marcaine plain, 0.5 cc of 1% Lidocaine, 0.5 cc kenalog 10. Disposition: Patient tolerated procedure well. Injection site dressed with a band-aid.  No follow-ups on file.

## 2020-12-04 ENCOUNTER — Ambulatory Visit: Payer: No Typology Code available for payment source | Admitting: Podiatry

## 2021-07-27 ENCOUNTER — Other Ambulatory Visit: Payer: Self-pay

## 2021-07-27 ENCOUNTER — Encounter: Payer: Self-pay | Admitting: Emergency Medicine

## 2021-07-27 ENCOUNTER — Emergency Department
Admission: EM | Admit: 2021-07-27 | Discharge: 2021-07-27 | Disposition: A | Discharge status: Home / Self Care | Payer: No Typology Code available for payment source | Attending: Student in an Organized Health Care Education/Training Program | Admitting: Student in an Organized Health Care Education/Training Program

## 2021-07-27 DIAGNOSIS — H6981 Other specified disorders of Eustachian tube, right ear: Secondary | ICD-10-CM

## 2021-07-27 DIAGNOSIS — H938X1 Other specified disorders of right ear: Secondary | ICD-10-CM | POA: Diagnosis present

## 2021-07-27 MED ORDER — FLUTICASONE PROPIONATE 50 MCG/ACT NA SUSP: 1.0000 | Freq: Two times a day (BID) | NASAL | 0 refills | Status: DC

## 2021-07-27 MED ORDER — CETIRIZINE HCL 10 MG PO TABS
10.0000 mg | ORAL_TABLET | Freq: Every day | ORAL | 0 refills | Status: AC
Start: 2021-07-27 — End: ?

## 2021-07-27 NOTE — ED Triage Notes (Signed)
Pt to ED from home c/o right ear fullness since yesterday.  Denies injury, drainage, pain, fevers or congestion.

## 2021-07-27 NOTE — ED Provider Notes (Signed)
[facility] Provider Note  Patient Contact: 8:49 PM (approximate)   History   Ear Fullness   HPI  Erika Elliott is a 56 y.o. female who presents the emergency department complaining of ear fullness to the right side.  She states that she is also had some muffled hearing.  No fevers or chills, no pain, no nasal congestion.  No history of allergies.  No history of ear problems in the past.  She has not put anything into her ears.  No recent water exposure.     Physical Exam   Triage Vital Signs: ED Triage Vitals  Enc Vitals Group     BP 07/27/21 1818 (!) 147/81     Pulse Rate 07/27/21 1818 93     Resp 07/27/21 1818 18     Temp 07/27/21 1818 98.6 F (37 C)     Temp Source 07/27/21 1818 Oral     SpO2 07/27/21 1818 100 %     Weight 07/27/21 1908 247 lb (112 kg)     Height 07/27/21 1908 5\' 4"  (1.626 m)     Head Circumference --      Peak Flow --      Pain Score 07/27/21 1907 0     Pain Loc --      Pain Edu? --      Excl. in GC? --     Most recent vital signs: Vitals:   07/27/21 1818  BP: (!) 147/81  Pulse: 93  Resp: 18  Temp: 98.6 F (37 C)  SpO2: 100%     General: Alert and in no acute distress. ENT:      Ears: EACs unremarkable bilaterally.  TM is moderately bulging on the right side with no injection.  Minimal bulging to the left TM.      Nose: No congestion/rhinnorhea.      Mouth/Throat: Mucous membranes are moist. Cardiovascular:  Good peripheral perfusion Respiratory: Normal respiratory effort without tachypnea or retractions. Lungs CTAB. Musculoskeletal: Full range of motion to all extremities.  Neurologic:  No gross focal neurologic deficits are appreciated.  Skin:   No rash noted Other:   ED Results / Procedures / Treatments   Labs (all labs ordered are listed, but only abnormal results are displayed) Labs Reviewed - No data to display   EKG     RADIOLOGY    No results found.  PROCEDURES:  Critical  Care performed: No  Procedures   MEDICATIONS ORDERED IN ED: Medications - No data to display   IMPRESSION / MDM / ASSESSMENT AND PLAN / ED COURSE  I reviewed the triage vital signs and the nursing notes.                              Differential diagnosis includes, but is not limited to, eustachian tube dysfunction, otitis media, otitis externa   Patient's diagnosis is consistent with eustachian tube dysfunction right side.  Patient presented to the emergency department with ear fullness and decreased hearing in the right ear.  No evidence of foreign body or infection on exam.  Patient had findings consistent with eustachian tube dysfunction.  No evidence of otitis externa or otitis media.  Patient will be placed on Flonase and Zyrtec for her eustachian tube dysfunction.  Follow-up primary care as needed.. Patient is given ED precautions to return to the ED for any worsening or new symptoms.  FINAL CLINICAL IMPRESSION(S) / ED DIAGNOSES   Final diagnoses:  Dysfunction of right eustachian tube     Rx / DC Orders   ED Discharge Orders          Ordered    fluticasone (FLONASE) 50 MCG/ACT nasal spray  2 times daily        07/27/21 2051    cetirizine (ZYRTEC) 10 MG tablet  Daily        07/27/21 2051             Note:  This document was prepared using Dragon voice recognition software and may include unintentional dictation errors.   Lanette Hampshire 07/27/21 2051    Willy Eddy, MD 07/27/21 2351

## 2023-01-16 ENCOUNTER — Encounter: Payer: Self-pay | Admitting: Podiatry

## 2023-01-16 ENCOUNTER — Ambulatory Visit (INDEPENDENT_AMBULATORY_CARE_PROVIDER_SITE_OTHER): Payer: No Typology Code available for payment source | Admitting: Podiatry

## 2023-01-16 DIAGNOSIS — M722 Plantar fascial fibromatosis: Secondary | ICD-10-CM | POA: Diagnosis not present

## 2023-01-16 MED ORDER — TRIAMCINOLONE ACETONIDE 10 MG/ML IJ SUSP
20.00 mg | Freq: Once | INTRAMUSCULAR | Status: AC
Start: 2023-01-16 — End: 2023-01-16
  Administered 2023-01-16: 20 mg

## 2023-01-16 NOTE — Progress Notes (Signed)
Subjective:   Patient ID: Erika Elliott, female   DOB: 57 y.o.   MRN: [id number]   HPI Patient states the bottom of both of her heels are very sore and create inflammation.  States just started recently and was doing well   ROS      Objective:  Physical Exam  Neuro vas scaler status intact patient is a diabetic under reasonably good control moderate obesity with inflammation pain of the plantar fascia bilateral at insertion     Assessment:  Acute plantar fasciitis bilateral with inflammation fluid buildup     Plan:  H&P reviewed sterile prep injected the plantar fascia bilateral at insertion 3 mg Kenalog 5 mg Xylocaine advised on support and dispensed fascial bracing for both feet which was tolerated well with education given.  Also placed on oral medicine and I do want her to watch her sugar more carefully in the next few days

## 2023-02-15 LAB — HM HEPATITIS C SCREENING LAB: HM Hepatitis Screen: NEGATIVE

## 2023-02-15 LAB — HM HIV SCREENING LAB: HM HIV Screening: NEGATIVE

## 2023-04-05 ENCOUNTER — Emergency Department
Admission: EM | Admit: 2023-04-05 | Discharge: 2023-04-05 | Disposition: A | Discharge status: Home / Self Care | Payer: PRIVATE HEALTH INSURANCE | Attending: Emergency Medicine | Admitting: Emergency Medicine

## 2023-04-05 ENCOUNTER — Encounter: Payer: Self-pay | Admitting: *Deleted

## 2023-04-05 ENCOUNTER — Emergency Department: Payer: PRIVATE HEALTH INSURANCE

## 2023-04-05 ENCOUNTER — Other Ambulatory Visit: Payer: Self-pay

## 2023-04-05 DIAGNOSIS — E1165 Type 2 diabetes mellitus with hyperglycemia: Secondary | ICD-10-CM | POA: Insufficient documentation

## 2023-04-05 DIAGNOSIS — R200 Anesthesia of skin: Secondary | ICD-10-CM | POA: Diagnosis present

## 2023-04-05 DIAGNOSIS — I10 Essential (primary) hypertension: Secondary | ICD-10-CM | POA: Diagnosis not present

## 2023-04-05 DIAGNOSIS — R531 Weakness: Secondary | ICD-10-CM | POA: Insufficient documentation

## 2023-04-05 DIAGNOSIS — R739 Hyperglycemia, unspecified: Secondary | ICD-10-CM

## 2023-04-05 LAB — CBC
HCT: 42.4 % (ref 36.0–46.0)
Hemoglobin: 13.9 g/dL (ref 12.0–15.0)
MCH: 28.5 pg (ref 26.0–34.0)
MCHC: 32.8 g/dL (ref 30.0–36.0)
MCV: 87.1 fL (ref 80.0–100.0)
Platelets: 288 10*3/uL (ref 150–400)
RBC: 4.87 MIL/uL (ref 3.87–5.11)
RDW: 12.7 % (ref 11.5–15.5)
WBC: 6.1 10*3/uL (ref 4.0–10.5)
nRBC: 0 % (ref 0.0–0.2)

## 2023-04-05 LAB — DIFFERENTIAL
Abs Immature Granulocytes: 0.04 10*3/uL (ref 0.00–0.07)
Basophils Absolute: 0 10*3/uL (ref 0.0–0.1)
Basophils Relative: 0 %
Eosinophils Absolute: 0 10*3/uL (ref 0.0–0.5)
Eosinophils Relative: 1 %
Immature Granulocytes: 1 %
Lymphocytes Relative: 31 %
Lymphs Abs: 1.9 10*3/uL (ref 0.7–4.0)
Monocytes Absolute: 0.5 10*3/uL (ref 0.1–1.0)
Monocytes Relative: 7 %
Neutro Abs: 3.7 10*3/uL (ref 1.7–7.7)
Neutrophils Relative %: 60 %

## 2023-04-05 LAB — COMPREHENSIVE METABOLIC PANEL
ALT: 30 U/L (ref 0–44)
AST: 23 U/L (ref 15–41)
Albumin: 4.1 g/dL (ref 3.5–5.0)
Alkaline Phosphatase: 107 U/L (ref 38–126)
Anion gap: 11 (ref 5–15)
BUN: 18 mg/dL (ref 6–20)
CO2: 27 mmol/L (ref 22–32)
Calcium: 9.6 mg/dL (ref 8.9–10.3)
Chloride: 100 mmol/L (ref 98–111)
Creatinine, Ser: 0.84 mg/dL (ref 0.44–1.00)
GFR, Estimated: >60 mL/min (ref >60)
Glucose, Bld: 250 mg/dL — ABNORMAL HIGH (ref 70–99)
Potassium: 3.7 mmol/L (ref 3.5–5.1)
Sodium: 138 mmol/L (ref 135–145)
Total Bilirubin: 0.6 mg/dL (ref 0.3–1.2)
Total Protein: 7.7 g/dL (ref 6.5–8.1)

## 2023-04-05 LAB — APTT: aPTT: 28 seconds (ref 24–36)

## 2023-04-05 LAB — ETHANOL: Alcohol, Ethyl (B): <10 mg/dL (ref <10)

## 2023-04-05 LAB — CBG MONITORING, ED: Glucose-Capillary: 246 mg/dL — ABNORMAL HIGH (ref 70–99)

## 2023-04-05 LAB — PROTIME-INR
INR: 1 (ref 0.8–1.2)
Prothrombin Time: 13.8 seconds (ref 11.4–15.2)

## 2023-04-05 LAB — TROPONIN I (HIGH SENSITIVITY): Troponin I (High Sensitivity): 4 ng/L (ref <18)

## 2023-04-05 MED ORDER — JARDIANCE 10 MG PO TABS: 20.0000 mg | ORAL_TABLET | Freq: Every day | ORAL | 0 refills | Status: DC

## 2023-04-05 MED ORDER — CYCLOBENZAPRINE HCL 5 MG PO TABS: 5.0000 mg | ORAL_TABLET | Freq: Three times a day (TID) | ORAL | 0 refills | Status: AC | PRN

## 2023-04-05 MED ORDER — SODIUM CHLORIDE 0.9% FLUSH: 3.0000 mL | Freq: Once | INTRAVENOUS | Status: DC

## 2023-04-05 NOTE — Discharge Instructions (Addendum)
Please use ibuprofen (Motrin) up to 800 mg every 8 hours, naproxen (Naprosyn) up to 500 mg every 12 hours, and/or acetaminophen (Tylenol) up to 4 g/day for any continued pain.  Please do not use this medication regimen for longer than 7 days

## 2023-04-05 NOTE — ED Triage Notes (Addendum)
Pt reports right arm numbness with feeling like 3 fingers on right hand that feel like they are asleep. Right  arm weaker than right.  This began around 9pm yesterday.  Pt also reports that she can not get her blood sugar under control, this has been going on for a month and last CBG was 257 pta.  Pt also reports that her legs have been swollen and are feeling weak.  No CP and no other numbness or weakness.

## 2023-04-05 NOTE — ED Triage Notes (Signed)
First nurse note: pt reports right arm weakness and numbness and bilateral leg weakness. Ambulatory, steady gait, NAD noted. Reports "bad diabetic"

## 2023-04-05 NOTE — ED Provider Notes (Signed)
[facility] Provider Note   Event Date/Time   First MD Initiated Contact with Patient 04/05/23 1150     (approximate) History  Numbness  HPI Erika Elliott is a 57 y.o. female with a stated past medical history of hypertension and type 2 diabetes who presents complaining of right arm numbness that began last night and woke her from sleep.  Patient states that she has been having this numbness since onset and has been stable.  Patient denies any exertional worsening or any other exacerbating or relieving factors.  Patient also states that she has had generalized weakness over the past 48 hours.  Patient also endorses hyperglycemia over the last month that she has been "unable to get down" ROS: Patient currently denies any vision changes, tinnitus, difficulty speaking, facial droop, sore throat, chest pain, shortness of breath, abdominal pain, nausea/vomiting/diarrhea, dysuria, or paresthesias in any extremity   Physical Exam  Triage Vital Signs: ED Triage Vitals  Encounter Vitals Group     BP 04/05/23 1123 (!) 163/81     Systolic BP Percentile --      Diastolic BP Percentile --      Pulse Rate 04/05/23 1123 84     Resp 04/05/23 1123 18     Temp 04/05/23 1123 98.7 F (37.1 C)     Temp Source 04/05/23 1123 Oral     SpO2 04/05/23 1123 97 %     Weight --      Height --      Head Circumference --      Peak Flow --      Pain Score 04/05/23 1126 7     Pain Loc --      Pain Education --      Exclude from Growth Chart --    Most recent vital signs: Vitals:   04/05/23 1206 04/05/23 1505  BP: (!) 163/83 132/71  Pulse: 86 77  Resp:  16  Temp:    SpO2: 95% 100%   General: Awake, oriented x4. CV:  Good peripheral perfusion.  Resp:  Normal effort.  Abd:  No distention.  Other:  Middle-aged obese African-American female resting comfortably in no acute distress.  Decreased sensation over left upper extremity to the neck with mild tenderness to palpation  overlying the right cervical paraspinal musculature ED Results / Procedures / Treatments  Labs (all labs ordered are listed, but only abnormal results are displayed) Labs Reviewed  COMPREHENSIVE METABOLIC PANEL - Abnormal; Notable for the following components:      Result Value   Glucose, Bld 250 (*)    All other components within normal limits  CBG MONITORING, ED - Abnormal; Notable for the following components:   Glucose-Capillary 246 (*)    All other components within normal limits  PROTIME-INR  APTT  CBC  DIFFERENTIAL  ETHANOL  TROPONIN I (HIGH SENSITIVITY)   EKG ED ECG REPORT I, Merwyn Katos, the attending physician, personally viewed and interpreted this ECG. Date: 04/05/2023 EKG Time: 1127 Rate: 88 Rhythm: normal sinus rhythm QRS Axis: normal Intervals: normal ST/T Wave abnormalities: normal Narrative Interpretation: no evidence of acute ischemia RADIOLOGY ED MD interpretation: CT of the head without contrast interpreted by me shows no evidence of acute abnormalities including no intracerebral hemorrhage, obvious masses, or significant edema -Agree with radiology assessment Official radiology report(s): CT Head Wo Contrast  Result Date: 04/05/2023 CLINICAL DATA:  Neuro deficit with acute stroke suspected EXAM: CT HEAD WITHOUT CONTRAST TECHNIQUE: Contiguous axial images  were obtained from the base of the skull through the vertex without intravenous contrast. RADIATION DOSE REDUCTION: This exam was performed according to the departmental dose-optimization program which includes automated exposure control, adjustment of the mA and/or kV according to patient size and/or use of iterative reconstruction technique. COMPARISON:  03/04/2016 FINDINGS: Brain: No evidence of acute infarction, hemorrhage, hydrocephalus, extra-axial collection or mass lesion/mass effect. Mild for age cerebral volume loss. Vascular: No hyperdense vessel or unexpected calcification. Skull: Normal.  Negative for fracture or focal lesion. Sinuses/Orbits: No acute finding. IMPRESSION: No acute or interval finding. Electronically Signed   By: Tiburcio Pea M.D.   On: 04/05/2023 12:54   PROCEDURES: Critical Care performed: No .1-3 Lead EKG Interpretation  Performed by: Merwyn Katos, MD Authorized by: Merwyn Katos, MD     Interpretation: normal     ECG rate:  71   ECG rate assessment: normal     Rhythm: sinus rhythm     Ectopy: none     Conduction: normal    MEDICATIONS ORDERED IN ED: Medications - No data to display  IMPRESSION / MDM / ASSESSMENT AND PLAN / ED COURSE  I reviewed the triage vital signs and the nursing notes.                             The patient is on the cardiac monitor to evaluate for evidence of arrhythmia and/or significant heart rate changes. Patient's presentation is most consistent with acute presentation with potential threat to life or bodily function. Presents with sensation of numbness to right upper extremity.  Patient's symptoms and work-up not consistent with a stroke and therefore they will be discharged from the ED.  Patient has currently been stabilized in the emergency department.  Patient received an head CT that was negative for stroke.  Patient's symptoms not typical for other emergent causes such as dissection, infection, DKA, trauma. Patient will be discharged with strict return precautions and follow up with primary MD within 24 hours for further evaluation. Clinical Course as of 04/06/23 0729  Wed Apr 05, 2023  1206 EKG 12-Lead [MM]    Clinical Course User Index [MM] Minor, Denice Paradise   FINAL CLINICAL IMPRESSION(S) / ED DIAGNOSES   Final diagnoses:  Right arm numbness  Hyperglycemia  Weakness   Rx / DC Orders   ED Discharge Orders          Ordered    cyclobenzaprine (FLEXERIL) 5 MG tablet  3 times daily PRN        04/05/23 1446    JARDIANCE 10 MG TABS tablet  Daily        04/05/23 1447            Note:  This document was prepared using Dragon voice recognition software and may include unintentional dictation errors.   Merwyn Katos, MD 04/06/23 0730

## 2023-06-02 ENCOUNTER — Ambulatory Visit (INDEPENDENT_AMBULATORY_CARE_PROVIDER_SITE_OTHER): Payer: No Typology Code available for payment source | Admitting: Podiatry

## 2023-06-02 ENCOUNTER — Encounter: Payer: Self-pay | Admitting: Podiatry

## 2023-06-02 DIAGNOSIS — E119 Type 2 diabetes mellitus without complications: Secondary | ICD-10-CM

## 2023-06-02 DIAGNOSIS — M722 Plantar fascial fibromatosis: Secondary | ICD-10-CM | POA: Diagnosis not present

## 2023-06-02 MED ORDER — MELOXICAM 15 MG PO TABS: 15.0000 mg | ORAL_TABLET | Freq: Every day | ORAL | 1 refills | Status: DC

## 2023-06-02 MED ORDER — BETAMETHASONE SOD PHOS & ACET 6 (3-3) MG/ML IJ SUSP: 3.0000 mg | Freq: Once | INTRAMUSCULAR | Status: AC

## 2023-06-02 MED ORDER — BETAMETHASONE SOD PHOS & ACET 6 (3-3) MG/ML IJ SUSP
3.0000 mg | Freq: Once | INTRAMUSCULAR | Status: AC
  Administered 2023-06-02: 3 mg via INTRA_ARTICULAR

## 2023-06-02 NOTE — Progress Notes (Signed)
   Chief Complaint  Patient presents with   Foot Pain    "I think I need a steroid shot again.  I can't put any pressure down on my feet."    Subjective: 57 y.o. female PMHx T2DM, last A1c on 05/15/2023 was 11.9, presenting today for follow-up evaluation of chronic plantar fasciitis to the bilateral feet.  Onset several years ago.  Patient has a long history of plantar fasciitis with intermittent pain.  She has been seen by multiple physicians both in our practice and outside of our practice.  She has received multiple cortisone injections and she currently wears custom orthotics with good supportive tennis shoes but she continues to have exacerbation of her plantar fasciitis.  Past Medical History:  Diagnosis Date   Diabetes mellitus without complication (HCC)    Hypertension    Neuropathy    Past Surgical History:  Procedure Laterality Date   ABDOMINAL HYSTERECTOMY     Allergies  Allergen Reactions   Oxycodone-Acetaminophen Nausea Only   Empagliflozin Other (See Comments)    Vaginal Itching / Yeast Infection     Objective: Physical Exam General: The patient is alert and oriented x3 in no acute distress.  Dermatology: Skin is warm, dry and supple bilateral lower extremities. Negative for open lesions or macerations bilateral.   Vascular: Dorsalis Pedis and Posterior Tibial pulses palpable bilateral.  Capillary fill time is immediate to all digits.  Neurological: Epicritic and protective threshold diminished bilaterally  Musculoskeletal: Pain to palpation to the plantar aspect of the bilateral heels along the plantar fascia. All other joints range of motion within normal limits bilateral. Strength 5/5 in all groups bilateral.    Assessment: 1. plantar fasciitis bilateral feet; chronic times several years 2.  Encounter for diabetic foot exam  Plan of Care:  -Patient evaluated.  Comprehensive diabetic foot exam performed today -Injection of 0.5 cc Celestone Soluspan  injected into the bilateral plantar fascia -Prescription for meloxicam 15 mg daily -Continue wearing good supportive tennis shoes and sneakers with her custom molded orthotics -Return to clinic 8 weeks.  Today we did discuss slightly the possibility of endoscopic plantar fasciotomy to the bilateral feet since she has had this for several years without any improvement despite multiple conservative modalities.  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 Felecia Shelling, DPM    [address] [place], [place] [address]                Office [phone]  Fax [phone]

## 2023-06-02 NOTE — Patient Instructions (Signed)
Endoscopic plantar fasciotomy

## 2023-07-25 ENCOUNTER — Ambulatory Visit: Payer: No Typology Code available for payment source | Admitting: Podiatry

## 2023-07-28 ENCOUNTER — Ambulatory Visit: Payer: No Typology Code available for payment source | Admitting: Podiatry

## 2023-08-11 ENCOUNTER — Ambulatory Visit: Payer: No Typology Code available for payment source | Admitting: Podiatry

## 2023-08-25 ENCOUNTER — Telehealth: Payer: Self-pay | Admitting: Podiatry

## 2023-08-25 NOTE — Telephone Encounter (Signed)
Patient called wanting a refill of her medication.

## 2023-08-27 ENCOUNTER — Other Ambulatory Visit: Payer: Self-pay | Admitting: Podiatry

## 2023-08-27 MED ORDER — MELOXICAM 15 MG PO TABS: 15.0000 mg | ORAL_TABLET | Freq: Every day | ORAL | 1 refills | Status: AC

## 2023-12-15 LAB — HEMOGLOBIN A1C: Hemoglobin A1C: 10.6

## 2023-12-21 ENCOUNTER — Other Ambulatory Visit: Payer: Self-pay | Admitting: Internal Medicine

## 2023-12-21 DIAGNOSIS — Z1231 Encounter for screening mammogram for malignant neoplasm of breast: Secondary | ICD-10-CM

## 2024-02-23 ENCOUNTER — Ambulatory Visit
Admission: RE | Admit: 2024-02-23 | Discharge: 2024-02-23 | Disposition: A | Discharge status: Home / Self Care | Payer: PRIVATE HEALTH INSURANCE | Source: Ambulatory Visit | Attending: Internal Medicine | Admitting: Internal Medicine

## 2024-02-23 DIAGNOSIS — Z1231 Encounter for screening mammogram for malignant neoplasm of breast: Secondary | ICD-10-CM | POA: Insufficient documentation

## 2024-02-27 ENCOUNTER — Encounter: Payer: Self-pay | Admitting: Internal Medicine

## 2024-02-28 ENCOUNTER — Encounter: Payer: Self-pay | Admitting: Internal Medicine

## 2024-03-18 ENCOUNTER — Ambulatory Visit
Admission: RE | Admit: 2024-03-18 | Discharge: 2024-03-18 | Disposition: A | Discharge status: Home / Self Care | Payer: PRIVATE HEALTH INSURANCE | Source: Ambulatory Visit | Attending: Internal Medicine | Admitting: Internal Medicine

## 2024-03-18 ENCOUNTER — Other Ambulatory Visit: Payer: Self-pay | Admitting: Internal Medicine

## 2024-03-18 DIAGNOSIS — R928 Other abnormal and inconclusive findings on diagnostic imaging of breast: Secondary | ICD-10-CM

## 2024-03-19 ENCOUNTER — Other Ambulatory Visit: Payer: Self-pay | Admitting: Internal Medicine

## 2024-03-19 DIAGNOSIS — R928 Other abnormal and inconclusive findings on diagnostic imaging of breast: Secondary | ICD-10-CM

## 2024-04-08 ENCOUNTER — Ambulatory Visit: Payer: PRIVATE HEALTH INSURANCE | Admitting: Family Medicine

## 2024-04-10 ENCOUNTER — Encounter: Payer: Self-pay | Admitting: Nurse Practitioner

## 2024-04-10 ENCOUNTER — Ambulatory Visit (INDEPENDENT_AMBULATORY_CARE_PROVIDER_SITE_OTHER): Payer: PRIVATE HEALTH INSURANCE | Admitting: Nurse Practitioner

## 2024-04-10 VITALS — BP 124/82 | HR 82 | Temp 98.1°F | Resp 18 | Ht 64.0 in | Wt 224.1 lb

## 2024-04-10 DIAGNOSIS — E1142 Type 2 diabetes mellitus with diabetic polyneuropathy: Secondary | ICD-10-CM

## 2024-04-10 DIAGNOSIS — E782 Mixed hyperlipidemia: Secondary | ICD-10-CM | POA: Diagnosis not present

## 2024-04-10 DIAGNOSIS — I10 Essential (primary) hypertension: Secondary | ICD-10-CM | POA: Diagnosis not present

## 2024-04-10 DIAGNOSIS — K760 Fatty (change of) liver, not elsewhere classified: Secondary | ICD-10-CM

## 2024-04-10 DIAGNOSIS — J4520 Mild intermittent asthma, uncomplicated: Secondary | ICD-10-CM

## 2024-04-10 DIAGNOSIS — Z1211 Encounter for screening for malignant neoplasm of colon: Secondary | ICD-10-CM

## 2024-04-10 DIAGNOSIS — G5601 Carpal tunnel syndrome, right upper limb: Secondary | ICD-10-CM

## 2024-04-10 DIAGNOSIS — J45909 Unspecified asthma, uncomplicated: Secondary | ICD-10-CM | POA: Insufficient documentation

## 2024-04-10 NOTE — Progress Notes (Signed)
 BP 124/82   Pulse 82   Temp 98.1 F (36.7 C)   Resp 18   Ht 5' 4 (1.626 m)   Wt 224 lb 1.6 oz (101.7 kg)   SpO2 97%   BMI 38.47 kg/m    Subjective:    Patient ID: Erika Elliott, female    DOB: [birth year], 58 y.o.   MRN: [id number]  HPI: Erika Elliott is a 58 y.o. female  Chief Complaint  Patient presents with   Establish Care   Diabetes   Hypertension   Hyperlipidemia    Pt states not taking chol. Med made hair fall out   Discussed the use of AI scribe software for clinical note transcription with the patient, who gave verbal consent to proceed.  History of Present Illness Erika Elliott is a 58 year old female who presents to establish care.  Hyperglycemia and diabetes management - Type 2 diabetes mellitus with last hemoglobin A1c of 10.6% in June 2025 - Current medications: Trulicity 1.5 mg weekly, metformin 500 mg twice daily, Novolog insulin 2 units with each meal - Uses Freestyle sensor for glucose monitoring - Elevated blood glucose readings: morning ~188 mg/dL, midday >799 mg/dL, evening >699 mg/dL despite minimal oral intake during the day  Hypertension management - Takes amlodipine 10 mg daily and losartan/hydrochlorothiazide 100/25 mg daily - Blood pressure well-controlled at home  Hyperlipidemia and statin intolerance - History of hyperlipidemia - Not currently taking atorvastatin due to hair loss - Previously trialed pravastatin  Asthma control - Uses albuterol as needed - Asthma is well-controlled  Peripheral neuropathy and neuropathic pain - Takes gabapentin 600 mg twice daily and duloxetine 60 mg daily for neuropathy and nerve pain - Numbness and pain in two fingers of right hand, causing nocturnal awakening - 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  Obesity and nonalcoholic fatty liver disease - Obesity and fatty liver disease present  Depression - Takes duloxetine 60 mg daily for depression  Colorectal cancer  screening - Due for colon cancer screening - Prefers Cologuard test over colonoscopy         04/10/2024    8:35 AM  Depression screen PHQ 2/9  Decreased Interest 0  Down, Depressed, Hopeless 0  PHQ - 2 Score 0  Altered sleeping 0  Tired, decreased energy 0  Change in appetite 0  Feeling bad or failure about yourself  0  Trouble concentrating 0  Moving slowly or fidgety/restless 0  Suicidal thoughts 0  PHQ-9 Score 0  Difficult doing work/chores Not difficult at all    Relevant past medical, surgical, family and social history reviewed and updated as indicated. Interim medical history since our last visit reviewed. Allergies and medications reviewed and updated.  Review of Systems  Constitutional: Negative for fever or weight change.  Respiratory: Negative for cough and shortness of breath.   Cardiovascular: Negative for chest pain or palpitations.  Gastrointestinal: Negative for abdominal pain, no bowel changes.  Musculoskeletal: Negative for gait problem or joint swelling.  Skin: Negative for rash.  Neurological: Negative for dizziness or headache.  No other specific complaints in a complete review of systems (except as listed in HPI above).      Objective:      BP 124/82   Pulse 82   Temp 98.1 F (36.7 C)   Resp 18   Ht 5' 4 (1.626 m)   Wt 224 lb 1.6 oz (101.7 kg)   SpO2 97%   BMI 38.47  kg/m    Wt Readings from Last 3 Encounters:  04/10/24 224 lb 1.6 oz (101.7 kg)  07/27/21 247 lb (112 kg)  03/04/16 252 lb (114.3 kg)    Physical Exam VITALS: BP- 124/82 MEASUREMENTS: Weight- 224, BMI- 38.47. GENERAL: Alert, cooperative, well developed, no acute distress. HEENT: Normocephalic, normal oropharynx, moist mucous membranes. CHEST: Clear to auscultation bilaterally, no wheezes, rhonchi, or crackles. CARDIOVASCULAR: Normal heart rate and rhythm, S1 and S2 normal without murmurs. ABDOMEN: Soft, non-tender, non-distended, without organomegaly, normal bowel  sounds. EXTREMITIES: No cyanosis or edema. NEUROLOGICAL: Cranial nerves grossly intact, moves all extremities without gross motor or sensory deficit.  Results for orders placed or performed in visit on 04/09/24  [facility] HIV SCREENING LAB   Collection Time: 02/15/23 12:00 AM  Result Value Ref Range   HM HIV Screening Negative - Validated   [facility] HEPATITIS C SCREENING LAB   Collection Time: 02/15/23 12:00 AM  Result Value Ref Range   HM Hepatitis Screen Negative-Validated           Assessment & Plan:   Problem List Items Addressed This Visit       Cardiovascular and Mediastinum   Hypertension, benign - Primary   Relevant Medications   losartan-hydrochlorothiazide (HYZAAR) 100-25 MG tablet   Other Relevant Orders   Comprehensive Metabolic Panel (CMET)   CBC with Differential/Platelet     Respiratory   Asthma     Digestive   Fatty liver disease, nonalcoholic     Endocrine   Diabetic neuropathy (HCC)   Relevant Medications   losartan-hydrochlorothiazide (HYZAAR) 100-25 MG tablet   Insulin Aspart FlexPen (NOVOLOG) 100 UNIT/ML   Type 2 diabetes mellitus with diabetic polyneuropathy, without long-term current use of insulin (HCC)   Relevant Medications   DULoxetine (CYMBALTA) 60 MG capsule   losartan-hydrochlorothiazide (HYZAAR) 100-25 MG tablet   Insulin Aspart FlexPen (NOVOLOG) 100 UNIT/ML   Other Relevant Orders   HgB A1c   Comprehensive Metabolic Panel (CMET)   Urine Microalbumin w/creat. ratio     Nervous and Auditory   Carpal tunnel syndrome of right wrist   Relevant Medications   DULoxetine (CYMBALTA) 60 MG capsule     Other   Hyperlipidemia   Relevant Medications   losartan-hydrochlorothiazide (HYZAAR) 100-25 MG tablet   Other Relevant Orders   Lipid Profile   Morbid (severe) obesity due to excess calories (HCC)   Relevant Medications   Insulin Aspart FlexPen (NOVOLOG) 100 UNIT/ML   Other Visit Diagnoses       Screening for colon cancer       Relevant  Orders   Cologuard        Assessment and Plan Assessment & Plan Type 2 diabetes mellitus with diabetic polyneuropathy Chronic type 2 diabetes with recent A1c of 10.6, indicating poor glycemic control. Diabetic polyneuropathy with symptoms of numbness and pain in fingers, likely exacerbated by diabetes. Current regimen includes Trulicity, metformin, and Novolog. Blood glucose levels are elevated throughout the day despite current treatment. - Order lab tests to assess current glycemic control - Consider increasing Trulicity dosage if A1c remains high - Continue metformin 500 mg twice daily - Continue Novolog 2 units with meals - Evaluate potential reintroduction of long-acting insulin if necessary - Refill Trulicity through [facility] mail order pharmacy  Hypertension Hypertension is well-controlled with a blood pressure reading of 124/82 mmHg. Current medications include amlodipine and losartan/hydrochlorothiazide combination. - Continue amlodipine 10 mg daily - Continue losartan/hydrochlorothiazide 100/25 mg daily - Monitor blood pressure at home  Obesity Obesity with a BMI of 38.47. Filed Weights   04/10/24 0831  Weight: 224 lb 1.6 oz (101.7 kg)    Flowsheet Row Office Visit from 04/10/2024 in [facility]  1 43 inches     Hyperlipidemia Hyperlipidemia with intolerance to atorvastatin and pravastatin due to hair loss.  Asthma Asthma is well-controlled with as-needed use of albuterol inhaler. - Continue albuterol inhaler as needed  Carpal tunnel syndrome, right hand Symptoms of numbness and pain in the right hand, likely due to carpal tunnel syndrome. - Recommend use of wrist splint at night - Consider referral for carpal tunnel surgery if symptoms persist  Fatty liver disease Fatty liver disease. -checking labs  Depression Depression managed with duloxetine 60 mg daily, which also aids in managing nerve pain. - Continue duloxetine 60 mg  daily  General Health Maintenance Due for colon cancer screening. Preference for non-invasive testing method. - Order Cologuard for colon cancer screening        Follow up plan: Return in about 3 months (around 07/11/2024) for follow up.

## 2024-04-11 ENCOUNTER — Other Ambulatory Visit: Payer: Self-pay | Admitting: Nurse Practitioner

## 2024-04-11 ENCOUNTER — Ambulatory Visit: Payer: Self-pay | Admitting: Nurse Practitioner

## 2024-04-11 DIAGNOSIS — E1142 Type 2 diabetes mellitus with diabetic polyneuropathy: Secondary | ICD-10-CM

## 2024-04-11 LAB — LIPID PANEL
Cholesterol: 274 mg/dL — ABNORMAL HIGH (ref <200)
HDL: 54 mg/dL (ref >=50)
LDL Cholesterol (Calc): 188 mg/dL — ABNORMAL HIGH
Non-HDL Cholesterol (Calc): 220 mg/dL — ABNORMAL HIGH (ref <130)
Total CHOL/HDL Ratio: 5.1 (calc) — ABNORMAL HIGH (ref <5.0)
Triglycerides: 158 mg/dL — ABNORMAL HIGH (ref <150)

## 2024-04-11 LAB — CBC WITH DIFFERENTIAL/PLATELET
Absolute Lymphocytes: 2066 {cells}/uL (ref 850–3900)
Absolute Monocytes: 515 {cells}/uL (ref 200–950)
Basophils Absolute: 51 {cells}/uL (ref 0–200)
Basophils Relative: 1 %
Eosinophils Absolute: 71 {cells}/uL (ref 15–500)
Eosinophils Relative: 1.4 %
HCT: 42.6 % (ref 35.0–45.0)
Hemoglobin: 13.7 g/dL (ref 11.7–15.5)
MCH: 28.6 pg (ref 27.0–33.0)
MCHC: 32.2 g/dL (ref 32.0–36.0)
MCV: 88.9 fL (ref 80.0–100.0)
MPV: 9.7 fL (ref 7.5–12.5)
Monocytes Relative: 10.1 %
Neutro Abs: 2397 {cells}/uL (ref 1500–7800)
Neutrophils Relative %: 47 %
Platelets: 282 Thousand/uL (ref 140–400)
RBC: 4.79 Million/uL (ref 3.80–5.10)
RDW: 12.5 % (ref 11.0–15.0)
Total Lymphocyte: 40.5 %
WBC: 5.1 Thousand/uL (ref 3.8–10.8)

## 2024-04-11 LAB — COMPREHENSIVE METABOLIC PANEL WITH GFR
AG Ratio: 1.5 (calc) (ref 1.0–2.5)
ALT: 22 U/L (ref 6–29)
AST: 14 U/L (ref 10–35)
Albumin: 4.4 g/dL (ref 3.6–5.1)
Alkaline phosphatase (APISO): 117 U/L (ref 37–153)
BUN: 21 mg/dL (ref 7–25)
CO2: 31 mmol/L (ref 20–32)
Calcium: 10.1 mg/dL (ref 8.6–10.4)
Chloride: 98 mmol/L (ref 98–110)
Creat: 0.93 mg/dL (ref 0.50–1.03)
Globulin: 2.9 g/dL (ref 1.9–3.7)
Glucose, Bld: 314 mg/dL — ABNORMAL HIGH (ref 65–99)
Potassium: 4.9 mmol/L (ref 3.5–5.3)
Sodium: 136 mmol/L (ref 135–146)
Total Bilirubin: 0.5 mg/dL (ref 0.2–1.2)
Total Protein: 7.3 g/dL (ref 6.1–8.1)
eGFR: 72 mL/min/1.73m2 (ref >=60)

## 2024-04-11 LAB — HEMOGLOBIN A1C
Hgb A1c MFr Bld: 10.6 % — ABNORMAL HIGH (ref <5.7)
Mean Plasma Glucose: 258 mg/dL
eAG (mmol/L): 14.3 mmol/L

## 2024-04-11 LAB — MICROALBUMIN / CREATININE URINE RATIO
Creatinine, Urine: 150 mg/dL (ref 20–275)
Microalb Creat Ratio: 6 mg/g{creat} (ref <30)
Microalb, Ur: 0.9 mg/dL

## 2024-04-11 MED ORDER — TRULICITY 3 MG/0.5ML ~~LOC~~ SOAJ: 3.0000 mg | SUBCUTANEOUS | 0 refills | Status: DC

## 2024-04-12 ENCOUNTER — Other Ambulatory Visit: Payer: Self-pay | Admitting: Nurse Practitioner

## 2024-04-12 ENCOUNTER — Other Ambulatory Visit: Payer: Self-pay

## 2024-04-12 DIAGNOSIS — Z789 Other specified health status: Secondary | ICD-10-CM

## 2024-04-12 DIAGNOSIS — E1142 Type 2 diabetes mellitus with diabetic polyneuropathy: Secondary | ICD-10-CM

## 2024-04-12 DIAGNOSIS — E782 Mixed hyperlipidemia: Secondary | ICD-10-CM

## 2024-04-12 MED ORDER — REPATHA SURECLICK 140 MG/ML ~~LOC~~ SOAJ: 140.0000 mg | SUBCUTANEOUS | 2 refills | Status: DC

## 2024-04-12 MED ORDER — TRULICITY 3 MG/0.5ML ~~LOC~~ SOAJ: 3.0000 mg | SUBCUTANEOUS | 0 refills | Status: DC

## 2024-04-12 MED ORDER — AMLODIPINE BESYLATE 10 MG PO TABS: 10.0000 mg | ORAL_TABLET | Freq: Every day | ORAL | 1 refills | Status: AC

## 2024-04-12 NOTE — Telephone Encounter (Unsigned)
 Copied from CRM #[id number]. Topic: Clinical - Medication Refill >> Apr 12, 2024 11:53 AM Everette C wrote: Medication: amLODipine (NORVASC) 10 MG tablet   Has the patient contacted their pharmacy? Yes (Agent: If no, request that the patient contact the pharmacy for the refill. If patient does not wish to contact the pharmacy document the reason why and proceed with request.) (Agent: If yes, when and what did the pharmacy advise?)  This is the patient's preferred pharmacy:  [phone] [place] - [address] [place] [address] Phone: [phone] Fax: [phone]  Is this the correct pharmacy for this prescription? Yes If no, delete pharmacy and type the correct one.   Has the prescription been filled recently? Yes  Is the patient out of the medication? Yes  Has the patient been seen for an appointment in the last year OR does the patient have an upcoming appointment? Yes  Can we respond through MyChart? No  Agent: Please be advised that Rx refills may take up to 3 business days. We ask that you follow-up with your pharmacy.

## 2024-04-22 ENCOUNTER — Telehealth: Payer: Self-pay

## 2024-04-22 ENCOUNTER — Other Ambulatory Visit (HOSPITAL_COMMUNITY): Payer: Self-pay

## 2024-04-22 NOTE — Telephone Encounter (Unsigned)
 Copied from CRM #[id number]. Topic: Clinical - Medication Prior Auth >> Apr 22, 2024 12:44 PM Fonda T wrote: Reason for CRM: Patient calling, and is requesting provider to send a PA to pharmacy for medication, Trulicity.  Per patient states both pharmacies listed will need PA, for the local pharmacy and the mail order pharmacy.  CB# [phone]  [id number] [place] - [address] [place] [address] Phone: [phone] Fax: [phone]  [facility] - [place], [place] - [address] [place] [address] Phone: [phone] Fax: [phone]

## 2024-04-23 ENCOUNTER — Telehealth: Payer: Self-pay | Admitting: Pharmacy Technician

## 2024-04-23 ENCOUNTER — Other Ambulatory Visit (HOSPITAL_COMMUNITY): Payer: Self-pay

## 2024-04-23 NOTE — Telephone Encounter (Signed)
 Pharmacy Patient Advocate Encounter   Received notification from Pt Calls Messages that prior authorization for Trulicity 3mg  is required/requested.   Insurance verification completed.   The patient is insured through [employer].   Per test claim: PA required; PA submitted to above mentioned insurance via Prompt PA Key/confirmation #/EOC [id number] Status is pending   *The PA was already started by [facility], I answered clinical questions and submitted chart notes, PA is pending*

## 2024-04-23 NOTE — Telephone Encounter (Signed)
 PA request has been Submitted. New Encounter has been or will be created for follow up. For additional info see Pharmacy Prior Auth telephone encounter from 04/23/2024.

## 2024-04-24 ENCOUNTER — Other Ambulatory Visit (HOSPITAL_COMMUNITY): Payer: Self-pay

## 2024-04-25 ENCOUNTER — Other Ambulatory Visit (HOSPITAL_COMMUNITY): Payer: Self-pay

## 2024-04-26 ENCOUNTER — Other Ambulatory Visit (HOSPITAL_COMMUNITY): Payer: Self-pay

## 2024-04-29 ENCOUNTER — Other Ambulatory Visit (HOSPITAL_COMMUNITY): Payer: Self-pay

## 2024-04-29 NOTE — Telephone Encounter (Signed)
 Pharmacy Patient Advocate Encounter  Received notification from [employer] that Prior Authorization for Trulicity 3mg   has been APPROVED from 04/25/24 to 04/26/25  Approval letter has been uploaded to media tab.   PA #/Case ID/Reference #: [id number]

## 2024-05-09 LAB — COLOGUARD: COLOGUARD: NEGATIVE

## 2024-06-28 ENCOUNTER — Ambulatory Visit: Payer: PRIVATE HEALTH INSURANCE

## 2024-06-28 ENCOUNTER — Encounter: Payer: Self-pay | Admitting: Podiatry

## 2024-06-28 ENCOUNTER — Ambulatory Visit (INDEPENDENT_AMBULATORY_CARE_PROVIDER_SITE_OTHER): Payer: PRIVATE HEALTH INSURANCE | Admitting: Podiatry

## 2024-06-28 VITALS — Ht 64.0 in | Wt 224.1 lb

## 2024-06-28 DIAGNOSIS — M722 Plantar fascial fibromatosis: Secondary | ICD-10-CM | POA: Diagnosis not present

## 2024-06-28 DIAGNOSIS — E119 Type 2 diabetes mellitus without complications: Secondary | ICD-10-CM | POA: Diagnosis not present

## 2024-06-28 DIAGNOSIS — Z0189 Encounter for other specified special examinations: Secondary | ICD-10-CM | POA: Diagnosis not present

## 2024-06-28 MED ORDER — MELOXICAM 15 MG PO TABS: 15.0000 mg | ORAL_TABLET | Freq: Every day | ORAL | 1 refills | Status: AC

## 2024-06-28 MED ORDER — BETAMETHASONE SOD PHOS & ACET 6 (3-3) MG/ML IJ SUSP
3.0000 mg | Freq: Once | INTRAMUSCULAR | Status: AC
  Administered 2024-06-28: 3 mg via INTRA_ARTICULAR

## 2024-06-28 MED ORDER — METHYLPREDNISOLONE 4 MG PO TBPK: ORAL_TABLET | ORAL | 0 refills | Status: DC

## 2024-06-28 NOTE — Progress Notes (Signed)
" ° °  No chief complaint on file.   Subjective: 58 y.o. female PMHx T2DM, last A1c on 05/15/2023 was 11.9, presenting today for follow-up evaluation of chronic plantar fasciitis to the bilateral feet.  Onset several years ago.  Patient has a long history of plantar fasciitis with intermittent pain.  She has been seen by multiple physicians both in our practice and outside of our practice.  She has received multiple cortisone injections and she currently wears custom orthotics with good supportive tennis shoes but she continues to have exacerbation of her plantar fasciitis.  Past Medical History:  Diagnosis Date   Diabetes mellitus without complication (HCC)    Hypertension    Neuropathy    Past Surgical History:  Procedure Laterality Date   ABDOMINAL HYSTERECTOMY     Allergies  Allergen Reactions   Oxycodone -Acetaminophen  Nausea Only   Empagliflozin  Other (See Comments)    Vaginal Itching / Yeast Infection     Objective: Physical Exam General: The patient is alert and oriented x3 in no acute distress.  Dermatology: Skin is warm, dry and supple bilateral lower extremities. Negative for open lesions or macerations bilateral.   Vascular: Dorsalis Pedis and Posterior Tibial pulses palpable bilateral.  Capillary fill time is immediate to all digits.  Neurological: Light touch and protective threshold diminished bilaterally  Musculoskeletal: Pain to palpation to the plantar aspect of the bilateral heels along the plantar fascia. All other joints range of motion within normal limits bilateral. Strength 5/5 in all groups bilateral.    Assessment: 1. plantar fasciitis bilateral feet; chronic x several years 2.  Encounter for diabetic foot exam  Plan of Care:  -Patient evaluated.  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 Thresa EMERSON Sar, DPM    [address] [place], [place] [address]                Office [phone]  Fax [phone]     "

## 2024-07-13 ENCOUNTER — Other Ambulatory Visit: Payer: Self-pay | Admitting: Nurse Practitioner

## 2024-07-13 DIAGNOSIS — E782 Mixed hyperlipidemia: Secondary | ICD-10-CM

## 2024-07-13 DIAGNOSIS — Z789 Other specified health status: Secondary | ICD-10-CM

## 2024-07-15 ENCOUNTER — Ambulatory Visit: Payer: PRIVATE HEALTH INSURANCE | Admitting: Nurse Practitioner

## 2024-07-15 ENCOUNTER — Encounter: Payer: Self-pay | Admitting: Nurse Practitioner

## 2024-07-15 ENCOUNTER — Other Ambulatory Visit (HOSPITAL_COMMUNITY)
Admission: RE | Admit: 2024-07-15 | Discharge: 2024-07-15 | Disposition: A | Discharge status: Home / Self Care | Payer: PRIVATE HEALTH INSURANCE | Source: Ambulatory Visit | Attending: Nurse Practitioner | Admitting: Nurse Practitioner

## 2024-07-15 VITALS — BP 110/74 | HR 89 | Temp 98.0°F | Ht 64.0 in | Wt 225.0 lb

## 2024-07-15 DIAGNOSIS — E782 Mixed hyperlipidemia: Secondary | ICD-10-CM

## 2024-07-15 DIAGNOSIS — E66812 Obesity, class 2: Secondary | ICD-10-CM

## 2024-07-15 DIAGNOSIS — Z789 Other specified health status: Secondary | ICD-10-CM

## 2024-07-15 DIAGNOSIS — F330 Major depressive disorder, recurrent, mild: Secondary | ICD-10-CM | POA: Diagnosis not present

## 2024-07-15 DIAGNOSIS — Z794 Long term (current) use of insulin: Secondary | ICD-10-CM

## 2024-07-15 DIAGNOSIS — E1142 Type 2 diabetes mellitus with diabetic polyneuropathy: Secondary | ICD-10-CM

## 2024-07-15 DIAGNOSIS — B379 Candidiasis, unspecified: Secondary | ICD-10-CM

## 2024-07-15 DIAGNOSIS — I10 Essential (primary) hypertension: Secondary | ICD-10-CM

## 2024-07-15 DIAGNOSIS — Z6838 Body mass index (BMI) 38.0-38.9, adult: Secondary | ICD-10-CM

## 2024-07-15 LAB — POCT GLYCOSYLATED HEMOGLOBIN (HGB A1C): Hemoglobin A1C: 11.9 % — AB (ref 4.0–5.6)

## 2024-07-15 MED ORDER — REPATHA SURECLICK 140 MG/ML ~~LOC~~ SOAJ: 140.0000 mg | SUBCUTANEOUS | 12 refills | Status: AC

## 2024-07-15 MED ORDER — FLUCONAZOLE 150 MG PO TABS: 150.0000 mg | ORAL_TABLET | ORAL | 0 refills | Status: DC | PRN

## 2024-07-15 MED ORDER — LOSARTAN POTASSIUM-HCTZ 100-25 MG PO TABS: 1.0000 | ORAL_TABLET | Freq: Every day | ORAL | 1 refills | Status: DC

## 2024-07-15 MED ORDER — DULOXETINE HCL 60 MG PO CPEP: 60.0000 mg | ORAL_CAPSULE | Freq: Every day | ORAL | 1 refills | Status: DC

## 2024-07-15 NOTE — Telephone Encounter (Signed)
 Duplicate request, refilled 07/16/23.  Requested Prescriptions  Pending Prescriptions Disp Refills   REPATHA  SURECLICK 140 MG/ML SOAJ [Pharmacy Med Name: REPATHA  140 MG/ML SURECLICK]  2    Sig: INJECT 140 MG INTO THE SKIN EVERY 14 (FOURTEEN) DAYS.     Cardiovascular: PCSK9 Inhibitors Passed - 07/15/2024  2:45 PM      Passed - Valid encounter within last 12 months    Recent Outpatient Visits           Today Hypertension, benign   [facility] Gareth Mliss [place], FNP   3 months ago Hypertension, benign   [facility] Gareth Mliss [place], [place]              Passed - Lipid Panel completed within the last 12 months    Cholesterol  Date Value Ref Range Status  04/10/2024 274 (H) <200 mg/dL Final   LDL Cholesterol (Calc)  Date Value Ref Range Status  04/10/2024 188 (H) mg/dL (calc) Final    Comment:    Reference range: <100 . Desirable range <100 mg/dL for primary prevention;   <70 mg/dL for patients with CHD or diabetic patients  with > or = 2 CHD risk factors. SABRA LDL-C is now calculated using the Martin-Hopkins  calculation, which is a validated novel method providing  better accuracy than the Friedewald equation in the  estimation of LDL-C.  Gladis APPLETHWAITE et al. SANDREA. 7986;689(80): 2061-2068  (http://education.[employer].com/faq/FAQ164)    HDL  Date Value Ref Range Status  04/10/2024 54 > OR = 50 mg/dL Final   Triglycerides  Date Value Ref Range Status  04/10/2024 158 (H) <150 mg/dL Final

## 2024-07-15 NOTE — Progress Notes (Signed)
 "  BP 110/74   Pulse 89   Temp 98 F (36.7 C)   Ht 5' 4 (1.626 m)   Wt 225 lb (102.1 kg)   SpO2 95%   BMI 38.62 kg/m    Subjective:    Patient ID: Erika Elliott, female    DOB: Dec 20, 1965, 59 y.o.   MRN: [id number]  HPI: Erika Elliott is a 59 y.o. female  Chief Complaint  Patient presents with   Medical Management of Chronic Issues   Discussed the use of AI scribe software for clinical note transcription with the patient, who gave verbal consent to proceed.  History of Present Illness Erika Elliott is a 59 year old female with type two diabetes, hypertension, and hyperlipidemia who presents for a three month follow-up.  Hyperglycemia and diabetes mellitus type 2 - Hemoglobin A1c increased from 10.6% (October 1st) to 11.9% (today) - Blood glucose was 314 on last comprehensive metabolic panel - Current medications: Trulicity  3 mg weekly, Novolog insulin 2 units with each meal, metformin 500 mg twice daily - A1c tends to be elevated after holidays - Vaginal itching occurs when blood sugar is elevated - Recent microalbumin urine test was normal  Diabetic polyneuropathy - Significant neuropathy pain, described as 'I got that bad' and 'I couldn't hardly walk' - Current medication: gabapentin 600 mg twice daily  Hypertension - Current medications: amlodipine  10 mg daily, losartan -hydrochlorothiazide 100-25 mg daily  Hyperlipidemia and statin intolerance - Current medication: Repatha  140 mg every 14 days - Statin intolerance due to hair loss - Recent lipid panel: LDL 188, triglycerides 158  Asthma - Uses albuterol inhaler as needed - Breathing has been stable  Plantar fasciitis - Currently taking meloxicam  15 mg daily - Under care of podiatrist  Depression - Current medication: duloxetine  60 mg daily  Preventive care and monitoring - Recent blood counts and comprehensive metabolic panel were normal - Due for diabetic eye exam   Obesity Wt Readings from  Last 3 Encounters:  07/15/24 225 lb (102.1 kg)  06/28/24 224 lb 1.6 oz (101.7 kg)  04/10/24 224 lb 1.6 oz (101.7 kg)   Body mass index is 38.62 kg/m.  Flowsheet Row Office Visit from 07/15/2024 in [facility] Office Visit from 04/10/2024 in [facility]  1 43 inches 43 inches         07/15/2024    8:08 AM 04/10/2024    8:35 AM  Depression screen PHQ 2/9  Decreased Interest 0 0  Down, Depressed, Hopeless 0 0  PHQ - 2 Score 0 0  Altered sleeping 0 0  Tired, decreased energy 0 0  Change in appetite 0 0  Feeling bad or failure about yourself  0 0  Trouble concentrating 0 0  Moving slowly or fidgety/restless 0 0  Suicidal thoughts 0 0  PHQ-9 Score 0 0   Difficult doing work/chores  Not difficult at all     Data saved with a previous flowsheet row definition    Relevant past medical, surgical, family and social history reviewed and updated as indicated. Interim medical history since our last visit reviewed. Allergies and medications reviewed and updated.  Review of Systems  Constitutional: Negative for fever or weight change.  Respiratory: Negative for cough and shortness of breath.   Cardiovascular: Negative for chest pain or palpitations.  Gastrointestinal: Negative for abdominal pain, no bowel changes.  Musculoskeletal: Negative for gait problem or joint swelling.  Skin: Negative for rash.  Neurological: Negative for dizziness or headache.  No other specific complaints in a complete review of systems (except as listed in HPI above).      Objective:      BP 110/74   Pulse 89   Temp 98 F (36.7 C)   Ht 5' 4 (1.626 m)   Wt 225 lb (102.1 kg)   SpO2 95%   BMI 38.62 kg/m    Wt Readings from Last 3 Encounters:  07/15/24 225 lb (102.1 kg)  06/28/24 224 lb 1.6 oz (101.7 kg)  04/10/24 224 lb 1.6 oz (101.7 kg)    Physical Exam GENERAL: Alert, cooperative, well developed, no acute distress HEENT: Normocephalic, normal  oropharynx, moist mucous membranes CHEST: Clear to auscultation bilaterally, no wheezes, rhonchi, or crackles CARDIOVASCULAR: Normal heart rate and rhythm, S1 and S2 normal without murmurs ABDOMEN: Soft, non-tender, non-distended, without organomegaly, normal bowel sounds EXTREMITIES: No cyanosis or edema NEUROLOGICAL: Cranial nerves grossly intact, moves all extremities without gross motor or sensory deficit  Results for orders placed or performed in visit on 07/15/24  POCT HgB A1C   Collection Time: 07/15/24  8:12 AM  Result Value Ref Range   Hemoglobin A1C 11.9 (A) 4.0 - 5.6 %   HbA1c POC (<> result, manual entry)     HbA1c, POC (prediabetic range)     HbA1c, POC (controlled diabetic range)            Assessment & Plan:   Problem List Items Addressed This Visit       Cardiovascular and Mediastinum   Hypertension, benign - Primary   Relevant Medications   Evolocumab  (REPATHA  SURECLICK) 140 MG/ML SOAJ   losartan -hydrochlorothiazide (HYZAAR) 100-25 MG tablet     Endocrine   Diabetic neuropathy (HCC)   Relevant Medications   losartan -hydrochlorothiazide (HYZAAR) 100-25 MG tablet   DULoxetine  (CYMBALTA ) 60 MG capsule   Other Relevant Orders   POCT HgB A1C (Completed)   Type 2 diabetes mellitus with diabetic polyneuropathy, without long-term current use of insulin (HCC)   Relevant Medications   losartan -hydrochlorothiazide (HYZAAR) 100-25 MG tablet   DULoxetine  (CYMBALTA ) 60 MG capsule   Other Relevant Orders   POCT HgB A1C (Completed)   Ambulatory referral to Ophthalmology   AMB Referral VBCI Care Management     Other   Hyperlipidemia   Relevant Medications   Evolocumab  (REPATHA  SURECLICK) 140 MG/ML SOAJ   losartan -hydrochlorothiazide (HYZAAR) 100-25 MG tablet   MDD (major depressive disorder)   Relevant Medications   DULoxetine  (CYMBALTA ) 60 MG capsule   Morbid (severe) obesity due to excess calories (HCC)   Other Visit Diagnoses       Statin intolerance        Relevant Medications   Evolocumab  (REPATHA  SURECLICK) 140 MG/ML SOAJ     Yeast infection       Relevant Medications   fluconazole  (DIFLUCAN ) 150 MG tablet   Other Relevant Orders   Cervicovaginal ancillary only        Assessment and Plan Assessment & Plan Type 2 diabetes mellitus with diabetic polyneuropathy Type 2 diabetes with poor glycemic control, evidenced by an A1c of 11.9%. Reports difficulty managing diet during holidays. Currently on Trulicity , metformin, and Novolog insulin. Neuropathy symptoms present. - Referred to pharmacist Isaiah Ferries for diabetes management support - Continue Trulicity , metformin, and Novolog insulin - Ordered diabetic eye exam  Essential hypertension Hypertension managed with amlodipine  and losartan -hydrochlorothiazide. - Continue amlodipine  and losartan -hydrochlorothiazide  Mixed hyperlipidemia LDL of 188 mg/dL and triglycerides of 841 mg/dL.  Statin intolerance due to hair loss. Currently on Repatha . - Continue Repatha   Morbid obesity due to excess calories Morbid obesity contributing to overall health issues. - Encourage continuation of lifestyle modifications, including dietary management and regular exercise. -continue to increase physical activity, getting at least 150 min of physical activity a week.  Work on including runner, broadcasting/film/video 2 days a week.  - continue eating at a calorie deficit 1600-1700 cal a day, eating a well balanced diet with whole foods, avoiding processed foods.   Patient is motivated to continue working on lifestyle modification.    Major depressive disorder, recurrent, mild Major depressive disorder managed with duloxetine . - Continue duloxetine   Asthma Managed with albuterol inhaler as needed. 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 Currently managed with Repatha . - Continue Repatha   Plantar fasciitis Under care of podiatry.  Candidiasis of vulva and  vagina Vaginal itching likely due to yeast infection. Differential includes bacterial vaginosis. - Prescribed Diflucan  for yeast infection - Instructed to perform self-administered vaginal swab to rule out bacterial vaginosis        Follow up plan: Return in about 3 months (around 10/13/2024) for follow up. "

## 2024-07-16 LAB — CERVICOVAGINAL ANCILLARY ONLY
Bacterial Vaginitis (gardnerella): POSITIVE — AB
Candida Glabrata: POSITIVE — AB
Candida Vaginitis: POSITIVE — AB
Chlamydia: NEGATIVE
Comment: NEGATIVE
Comment: NEGATIVE
Comment: NEGATIVE
Comment: NEGATIVE
Comment: NEGATIVE
Comment: NORMAL
Neisseria Gonorrhea: NEGATIVE
Trichomonas: NEGATIVE

## 2024-07-17 ENCOUNTER — Other Ambulatory Visit: Payer: Self-pay | Admitting: Nurse Practitioner

## 2024-07-17 ENCOUNTER — Ambulatory Visit: Payer: Self-pay | Admitting: Nurse Practitioner

## 2024-07-17 DIAGNOSIS — N760 Acute vaginitis: Secondary | ICD-10-CM

## 2024-07-17 DIAGNOSIS — E1142 Type 2 diabetes mellitus with diabetic polyneuropathy: Secondary | ICD-10-CM

## 2024-07-17 MED ORDER — METRONIDAZOLE 500 MG PO TABS: 500.0000 mg | ORAL_TABLET | Freq: Two times a day (BID) | ORAL | 0 refills | Status: AC

## 2024-07-18 NOTE — Telephone Encounter (Signed)
 Requested Prescriptions  Pending Prescriptions Disp Refills   TRULICITY  3 MG/0.5ML SOAJ [Pharmacy Med Name: TRULICITY  3 MG/0.5 ML PEN] 6 mL 0    Sig: INJECT 3 MG INTO THE SKIN ONCE A WEEK.     Endocrinology:  Diabetes - GLP-1 Receptor Agonists Failed - 07/18/2024  2:43 PM      Failed - HBA1C is between 0 and 7.9 and within 180 days    Hemoglobin A1C  Date Value Ref Range Status  07/15/2024 11.9 (A) 4.0 - 5.6 % Final  12/15/2023 10.6  Final   Hgb A1c MFr Bld  Date Value Ref Range Status  04/10/2024 10.6 (H) <5.7 % Final    Comment:    For someone without known diabetes, a hemoglobin A1c value of 6.5% or greater indicates that they may have  diabetes and this should be confirmed with a follow-up  test. . For someone with known diabetes, a value <7% indicates  that their diabetes is well controlled and a value  greater than or equal to 7% indicates suboptimal  control. A1c targets should be individualized based on  duration of diabetes, age, comorbid conditions, and  other considerations. . Currently, no consensus exists regarding use of hemoglobin A1c for diagnosis of diabetes for children. SABRA Amy - Valid encounter within last 6 months    Recent Outpatient Visits           3 days ago Hypertension, benign   [facility] Gareth Mliss FALCON, FNP   3 months ago Hypertension, benign   [facility] Gareth Mliss FALCON, [place]

## 2024-07-19 ENCOUNTER — Telehealth: Payer: Self-pay

## 2024-07-19 DIAGNOSIS — I10 Essential (primary) hypertension: Secondary | ICD-10-CM

## 2024-07-19 DIAGNOSIS — F330 Major depressive disorder, recurrent, mild: Secondary | ICD-10-CM

## 2024-07-19 DIAGNOSIS — B379 Candidiasis, unspecified: Secondary | ICD-10-CM

## 2024-07-19 DIAGNOSIS — E1142 Type 2 diabetes mellitus with diabetic polyneuropathy: Secondary | ICD-10-CM

## 2024-07-19 MED ORDER — DULOXETINE HCL 60 MG PO CPEP: 60.0000 mg | ORAL_CAPSULE | Freq: Every day | ORAL | 1 refills | Status: AC

## 2024-07-19 MED ORDER — LOSARTAN POTASSIUM-HCTZ 100-25 MG PO TABS: 1.0000 | ORAL_TABLET | Freq: Every day | ORAL | 1 refills | Status: AC

## 2024-07-19 MED ORDER — FLUCONAZOLE 150 MG PO TABS: 150.0000 mg | ORAL_TABLET | ORAL | 0 refills | Status: AC | PRN

## 2024-07-19 NOTE — Telephone Encounter (Signed)
 Copied from CRM [id number]. Topic: Clinical - Prescription Issue >> Jul 18, 2024  4:42 PM Selinda RAMAN wrote: Reason for CRM: The patient called in stating her DULoxetine  (CYMBALTA ) 60 MG capsule and her  losartan -hydrochlorothiazide (HYZAAR) 100-25 MG tablet were sent to the wrong pharmacy. She no longer uses that one she only uses   [facility] ([address] [place], [place] - [address]  Phone: [phone] Fax: [phone]  She also would like for her fluconazole  (DIFLUCAN ) 150 MG tablet to be rerouted and sent to a local pharmacy as it is a 7 day supply. Please send that to  [id number] [place], [place] - [address]  Phone: [phone] Fax: [phone]

## 2024-07-19 NOTE — Telephone Encounter (Signed)
 Sent as requested.

## 2024-07-19 NOTE — Progress Notes (Signed)
 Complex Care Management Note  Care Guide Note 07/19/2024 Name: Erika Elliott MRN: [id number] DOB: 1965/09/30  Erika Elliott is a 59 y.o. year old female who sees Gareth Mliss FALCON, FNP for primary care. I reached out to [employer] by phone today to offer complex care management services.  [name] was given information about Complex Care Management services today including:   The Complex Care Management services include support from the care team which includes your Nurse Care Manager, Clinical Social Worker, or Pharmacist.  The Complex Care Management team is here to help remove barriers to the health concerns and goals most important to you. Complex Care Management services are voluntary, and the patient may decline or stop services at any time by request to their care team member.   Complex Care Management Consent Status: Patient agreed to services and verbal consent obtained.   Follow up plan:  Telephone appointment with complex care management team member scheduled for:  07/30/24 at 1:00 p.m.   Encounter Outcome:  Patient Scheduled  D[facility]  [facility] VBCI Assistant Direct Dial: [phone]  Fax: [phone]

## 2024-07-30 ENCOUNTER — Other Ambulatory Visit: Payer: PRIVATE HEALTH INSURANCE

## 2024-07-30 ENCOUNTER — Ambulatory Visit: Payer: PRIVATE HEALTH INSURANCE | Admitting: Podiatry

## 2024-07-30 DIAGNOSIS — E1142 Type 2 diabetes mellitus with diabetic polyneuropathy: Secondary | ICD-10-CM

## 2024-07-30 MED ORDER — INSULIN GLARGINE 100 UNIT/ML SOLOSTAR PEN: 16.0000 [IU] | PEN_INJECTOR | Freq: Every day | SUBCUTANEOUS | 5 refills | Status: AC

## 2024-07-30 MED ORDER — FREESTYLE LIBRE 3 PLUS SENSOR MISC: 3 refills | Status: AC

## 2024-07-30 MED ORDER — INSULIN GLARGINE 100 UNIT/ML SOLOSTAR PEN: 16.0000 [IU] | PEN_INJECTOR | Freq: Every day | SUBCUTANEOUS | 0 refills | Status: AC

## 2024-07-30 MED ORDER — METFORMIN HCL ER 500 MG PO TB24: 1000.0000 mg | ORAL_TABLET | Freq: Every day | ORAL | 1 refills | Status: AC

## 2024-07-30 MED ORDER — TRULICITY 4.5 MG/0.5ML ~~LOC~~ SOAJ: 4.5000 mg | SUBCUTANEOUS | 1 refills | Status: AC

## 2024-07-30 MED ORDER — PEN NEEDLES 31G X 5 MM MISC: 4 refills | Status: AC

## 2024-07-30 NOTE — Progress Notes (Signed)
 "  S:     Reason for visit: ?  Trude Cansler is a 59 y.o. female with a history of diabetes (type 2), who presents today for an initial diabetes Telephone pharmacotherapy visit.? Pertinent PMH also includes HTN, asthma, HLD, MDD.  They were referred to the pharmacist by their PCP for assistance in managing diabetes.  Care Team: Primary Care Provider: Gareth Mliss FALCON, FNP  Today, patient reports she is doing well on Trulicity . She reports she feels she has not been doing what she should be with DM management, but she does feel she has been able to be consistent with her medications, aside from PM metformin  doses. She was previously on basal insulin  therapy and thinks it would be beneficial to restart this. She was also previously on max dose Trulicity  in 12/2023 when she was also on basal insulin  without issues.   Current diabetes medications include: Trulicity  3 mg weekly, Novolog 2 units BID with meals, metformin  500 mg BID Previous diabetes medications include: Jardiance  (yeast infections), Ozempic, Basaglar   Current hypertension medications include: amlodipine  10 mg daily, losartan /hydrochlorothiazide 100/25 mg daily  Current hyperlipidemia medications include: Repatha  140 mg every 14 days  Patient reports adherence to taking all medications as prescribed, aside from metformin . She reports she frequently will forget the PM dose.   Have you been experiencing any side effects to the medications prescribed? no Do you have any problems obtaining medications due to transportation or finances? no Insurance coverage: nurse, learning disability  Patient denies hypoglycemic events.  Reported home fasting blood sugars: 220s  Reported 2 hour post-meal/random blood sugars: 300s   Patient reported dietary habits: Eats 2 meals/day  DM Prevention:  Statin: Not taking - reports hair loss ACE/ARB: yes; losartan /hydrochlorothiazide  Last urinary albumin/creatinine ratio:  Lab Results  Component  Value Date   MICRALBCREAT 6 04/10/2024   Last eye exam:     Last foot exam: No foot exam found Tobacco Use:  Tobacco Use: Low Risk (07/15/2024)   Patient History    Smoking Tobacco Use: Never    Smokeless Tobacco Use: Never    Passive Exposure: Not on file   O:   Vitals:  Wt Readings from Last 3 Encounters:  07/15/24 225 lb (102.1 kg)  06/28/24 224 lb 1.6 oz (101.7 kg)  04/10/24 224 lb 1.6 oz (101.7 kg)   BP Readings from Last 3 Encounters:  07/15/24 110/74  04/10/24 124/82  04/05/23 132/71   Pulse Readings from Last 3 Encounters:  07/15/24 89  04/10/24 82  04/05/23 77     Labs:?  Lab Results  Component Value Date   HGBA1C 11.9 (A) 07/15/2024   HGBA1C 10.6 (H) 04/10/2024   HGBA1C 10.6 12/15/2023   GLUCOSE 314 (H) 04/10/2024   MICRALBCREAT 6 04/10/2024   CREATININE 0.93 04/10/2024   CREATININE 0.84 04/05/2023   CREATININE 1.08 12/09/2011    Lab Results  Component Value Date   CHOL 274 (H) 04/10/2024   LDLCALC 188 (H) 04/10/2024   HDL 54 04/10/2024   TRIG 158 (H) 04/10/2024   ALT 22 04/10/2024   ALT 30 04/05/2023   AST 14 04/10/2024   AST 23 04/05/2023      Chemistry      Component Value Date/Time   NA 136 04/10/2024 0904   NA 137 12/09/2011 2331   K 4.9 04/10/2024 0904   K 3.1 (L) 12/09/2011 2331   CL 98 04/10/2024 0904   CL 98 12/09/2011 2331   CO2 31 04/10/2024 0904  CO2 28 12/09/2011 2331   BUN 21 04/10/2024 0904   BUN 17 12/09/2011 2331   CREATININE 0.93 04/10/2024 0904      Component Value Date/Time   CALCIUM 10.1 04/10/2024 0904   CALCIUM 9.0 12/09/2011 2331   ALKPHOS 107 04/05/2023 1129   ALKPHOS 51 12/09/2011 2331   AST 14 04/10/2024 0904   AST 27 12/09/2011 2331   ALT 22 04/10/2024 0904   ALT 38 12/09/2011 2331   BILITOT 0.5 04/10/2024 0904   BILITOT 0.6 12/09/2011 2331       The 10-year ASCVD risk score (Arnett DK, et al., 2019) is: 10.1%  Lab Results  Component Value Date   MICRALBCREAT 6 04/10/2024     A/P: Diabetes currently uncontrolled with a most recent A1c of 11.9% on 07/15/24, which is up from 10.6% on 04/10/24. Patient is able to verbalize appropriate hypoglycemia management plan. Medication adherence appears appropriate, aside from metformin  therapy. Patient reports frequently missing PM doses. Will switch to an ER formulation to allow for once daily administration. Patient has CGM at home but has not been using lately. Reported fasting BG readings ~220 mg/dL. Previously managed on basal insulin  therapy - will restart at this time. Additionally, patient has about 3 weeks left of Trulicity  3 mg. Will increase dose after completion of current stock. -Restarted basal insulin  Lantus  (insulin  glargine)  16 units daily.  -Continued rapid insulin  Novolog (insulin  aspart) 2 units BID with meals.  -Increased dose of GLP-1 Trulicity  (dulaglutide ) to 4.5 mg weekly in 3 weeks  -Switched metformin  from IR to ER 500 mg 2 tablets daily.  -Patient educated on purpose, proper use, and potential adverse effects of insulin .  -Extensively discussed pathophysiology of diabetes, recommended lifestyle interventions, dietary effects on blood sugar control.  -Counseled on s/sx of and management of hypoglycemia.  -Next A1c anticipated 10/2024.  -Restart using Freestyle Libre 3+ for BG monitoring  ASCVD risk - primary prevention in patient with diabetes. Last LDL is 188 mg/dL, not at goal of <29 mg/dL. Last LDL is prior to Repatha  initiation. May discuss re-trial of statin in the future.  -Continued Repatha  140 mg every 2 weeks  Patient verbalized understanding of treatment plan. Total time patient counseling 60 minutes.  Follow-up:  Pharmacist on 08/27/24 PCP clinic visit on 10/23/24  Peyton CHARLENA Ferries, PharmD, BCACP, CPP Clinical Pharmacist [facility] [phone]   "

## 2024-08-27 ENCOUNTER — Ambulatory Visit: Payer: PRIVATE HEALTH INSURANCE

## 2024-10-23 ENCOUNTER — Ambulatory Visit: Payer: PRIVATE HEALTH INSURANCE | Admitting: Nurse Practitioner
